# Patient Record
Sex: Female | Born: 1945 | Race: White | Hispanic: No | Marital: Married
Health system: Southern US, Community
[De-identification: ages and names within clinical notes are randomized; demographics above are authoritative.]

---

## 2002-12-24 HISTORY — PX: BREAST EXCISIONAL BIOPSY: SUR124

## 2005-09-24 ENCOUNTER — Ambulatory Visit: Payer: Self-pay | Admitting: Internal Medicine

## 2005-10-10 ENCOUNTER — Ambulatory Visit: Payer: Self-pay | Admitting: Internal Medicine

## 2005-10-22 ENCOUNTER — Ambulatory Visit: Payer: Self-pay | Admitting: Rheumatology

## 2005-12-10 ENCOUNTER — Ambulatory Visit: Payer: Self-pay

## 2006-06-19 ENCOUNTER — Ambulatory Visit: Payer: Self-pay | Admitting: Rheumatology

## 2009-10-18 ENCOUNTER — Ambulatory Visit: Payer: Self-pay | Admitting: Internal Medicine

## 2011-01-01 ENCOUNTER — Ambulatory Visit: Payer: Self-pay | Admitting: Internal Medicine

## 2011-01-17 ENCOUNTER — Ambulatory Visit: Payer: Self-pay | Admitting: Unknown Physician Specialty

## 2011-02-15 ENCOUNTER — Ambulatory Visit: Payer: Self-pay | Admitting: Unknown Physician Specialty

## 2011-02-19 ENCOUNTER — Ambulatory Visit: Payer: Self-pay | Admitting: Unknown Physician Specialty

## 2011-08-29 ENCOUNTER — Ambulatory Visit: Payer: Self-pay | Admitting: Unknown Physician Specialty

## 2013-10-19 ENCOUNTER — Ambulatory Visit: Payer: Self-pay | Admitting: Internal Medicine

## 2013-12-10 ENCOUNTER — Ambulatory Visit: Payer: Self-pay | Admitting: Physician Assistant

## 2013-12-10 LAB — CBC WITH DIFFERENTIAL/PLATELET
Basophil %: 0.8 %
Eosinophil #: 0.3 10*3/uL (ref 0.0–0.7)
Eosinophil %: 3.4 %
HCT: 39.4 % (ref 35.0–47.0)
HGB: 13.3 g/dL (ref 12.0–16.0)
Lymphocyte %: 15.1 %
MCH: 31.5 pg (ref 26.0–34.0)
MCHC: 33.8 g/dL (ref 32.0–36.0)
Monocyte #: 0.7 x10 3/mm (ref 0.2–0.9)
Monocyte %: 6.8 %
Neutrophil %: 73.9 %
Platelet: 265 10*3/uL (ref 150–440)
RDW: 13.4 % (ref 11.5–14.5)

## 2013-12-10 LAB — COMPREHENSIVE METABOLIC PANEL
Anion Gap: 10 (ref 7–16)
Calcium, Total: 9.3 mg/dL (ref 8.5–10.1)
Chloride: 102 mmol/L (ref 98–107)
Co2: 26 mmol/L (ref 21–32)
EGFR (African American): >60
EGFR (Non-African Amer.): 58 — ABNORMAL LOW
Glucose: 117 mg/dL — ABNORMAL HIGH (ref 65–99)
SGOT(AST): 24 U/L (ref 15–37)
SGPT (ALT): 36 U/L (ref 12–78)
Sodium: 138 mmol/L (ref 136–145)
Total Protein: 7.5 g/dL (ref 6.4–8.2)

## 2013-12-11 ENCOUNTER — Ambulatory Visit: Payer: Self-pay | Admitting: Physician Assistant

## 2014-10-13 ENCOUNTER — Ambulatory Visit: Payer: Self-pay

## 2015-08-22 ENCOUNTER — Other Ambulatory Visit: Payer: Self-pay | Admitting: Internal Medicine

## 2015-08-22 DIAGNOSIS — Z1231 Encounter for screening mammogram for malignant neoplasm of breast: Secondary | ICD-10-CM

## 2016-12-26 ENCOUNTER — Other Ambulatory Visit: Payer: Self-pay | Admitting: Internal Medicine

## 2016-12-26 DIAGNOSIS — N6320 Unspecified lump in the left breast, unspecified quadrant: Secondary | ICD-10-CM

## 2017-01-04 ENCOUNTER — Ambulatory Visit: Payer: Self-pay

## 2017-01-04 ENCOUNTER — Other Ambulatory Visit: Payer: Self-pay

## 2017-01-17 ENCOUNTER — Ambulatory Visit
Admission: RE | Admit: 2017-01-17 | Discharge: 2017-01-17 | Disposition: A | Discharge status: Home / Self Care | Payer: 59 | Source: Ambulatory Visit | Attending: Internal Medicine | Admitting: Internal Medicine

## 2017-01-17 ENCOUNTER — Encounter: Payer: Self-pay | Admitting: Radiology

## 2017-01-17 DIAGNOSIS — R921 Mammographic calcification found on diagnostic imaging of breast: Secondary | ICD-10-CM | POA: Diagnosis not present

## 2017-01-17 DIAGNOSIS — N6320 Unspecified lump in the left breast, unspecified quadrant: Secondary | ICD-10-CM

## 2018-05-16 IMAGING — US US BREAST*L* LIMITED INC AXILLA
1 series · 16 of 25 positions shown · non-contrast
Comparison: Previous exam(s).

CLINICAL DATA: Left breast upper outer quadrant area of palpable
concern. History of right breast excisional biopsy for multiple
papillomas.

EXAM:
2D DIGITAL DIAGNOSTIC BILATERAL MAMMOGRAM WITH CAD AND ADJUNCT TOMO
ULTRASOUND BILATERAL BREAST

[Series 1: us breast*left* limited inc axilla · 0.06mm/px · 16 of 52 slices shown]
[im 1/52]
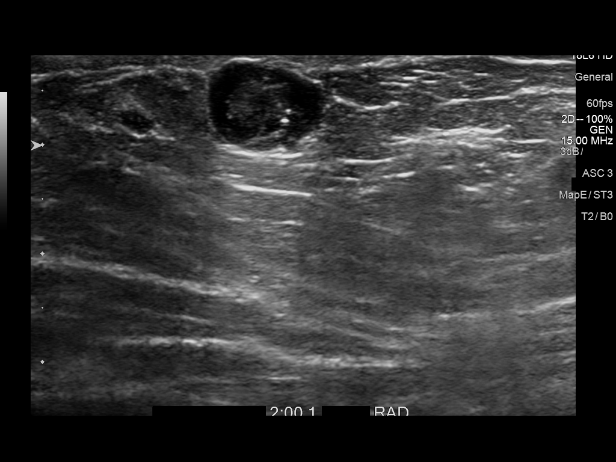
[im 5/52]
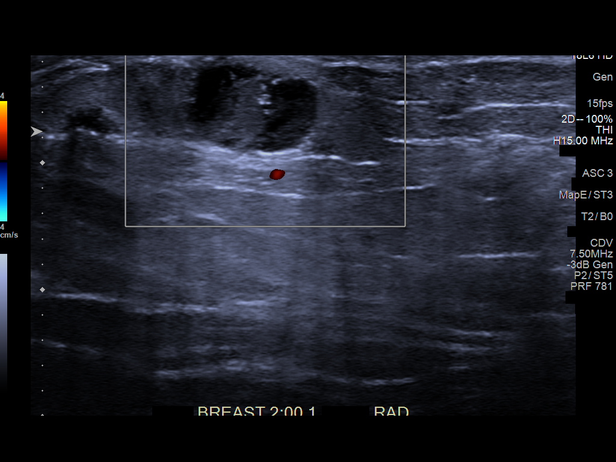
[im 7/52]
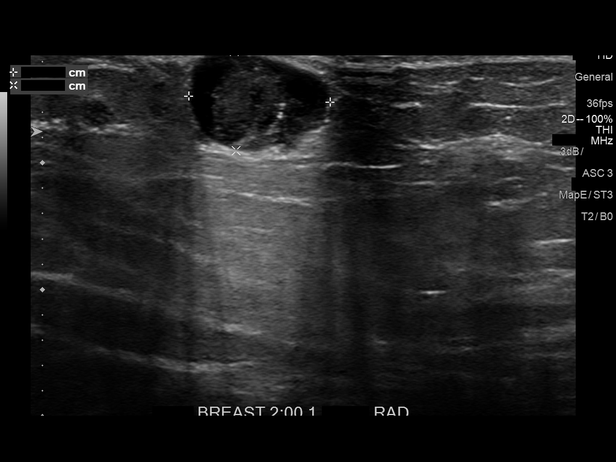
[im 11/52]
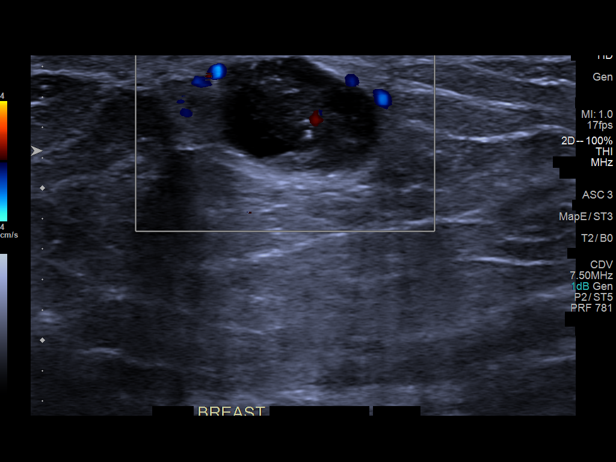
[im 15/52]
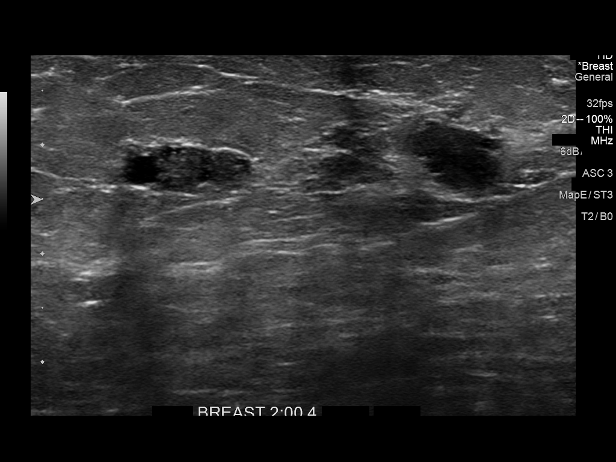
[im 18/52]
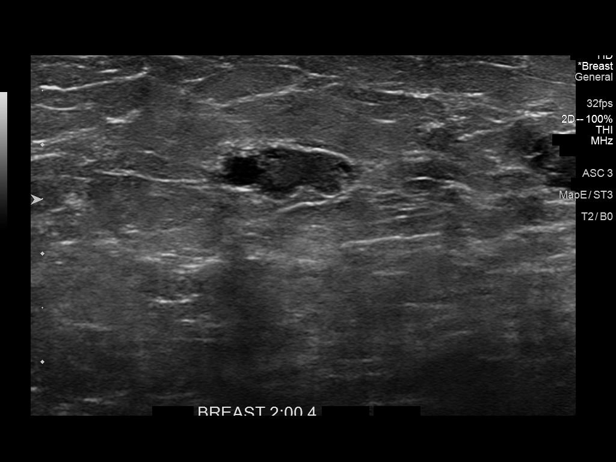
[im 22/52]
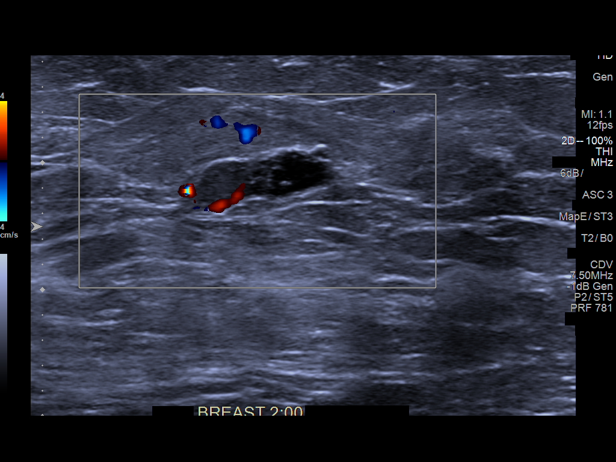
[im 24/52]
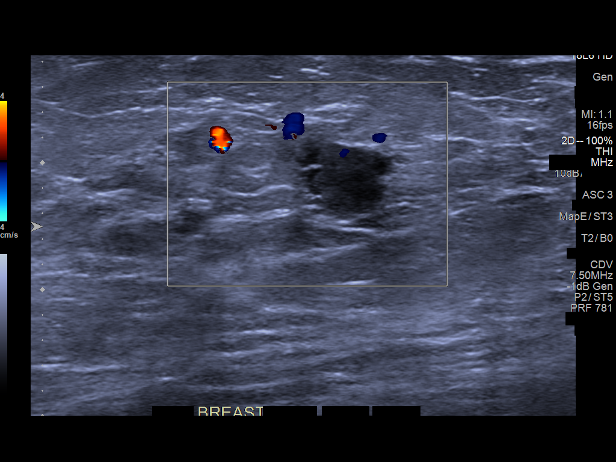
[im 28/52]
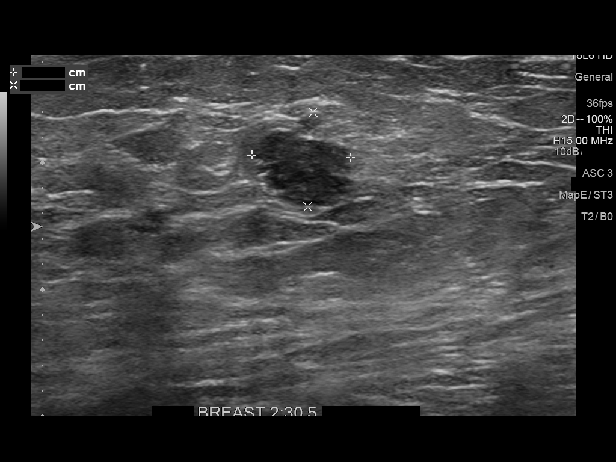
[im 30/52]
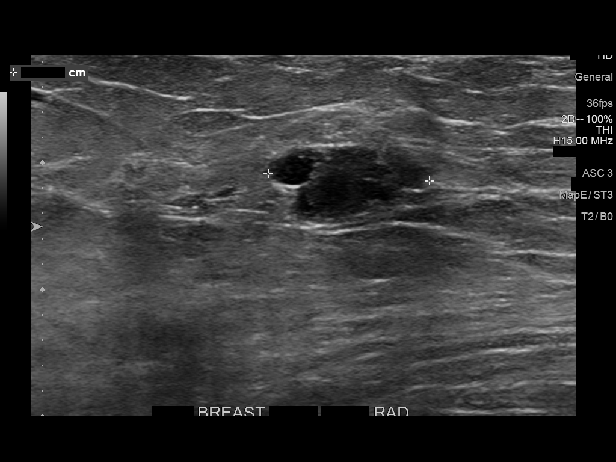
[im 35/52]
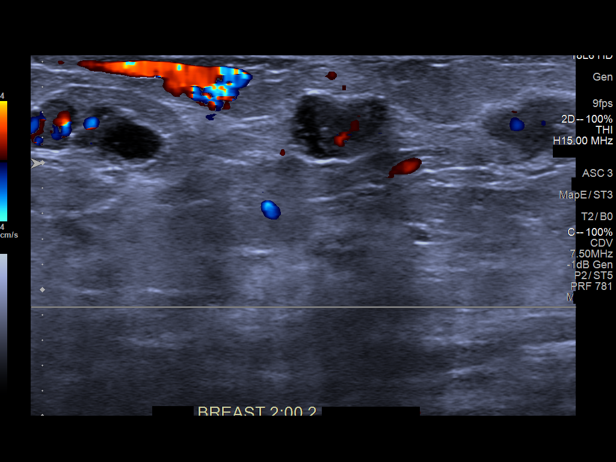
[im 37/52]
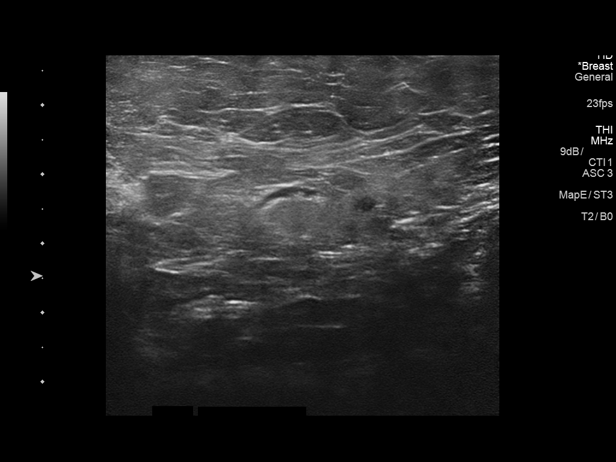
[im 41/52]
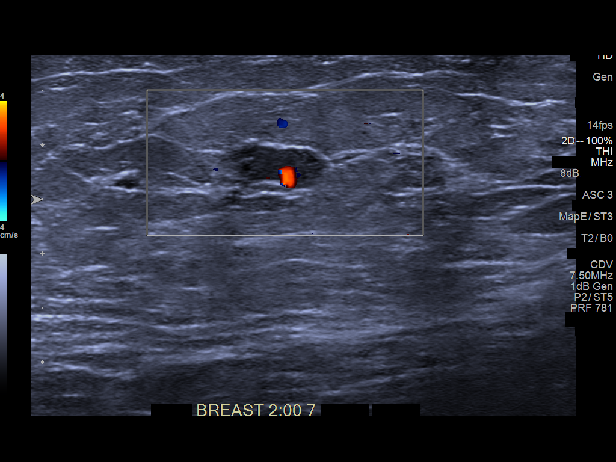
[im 45/52]
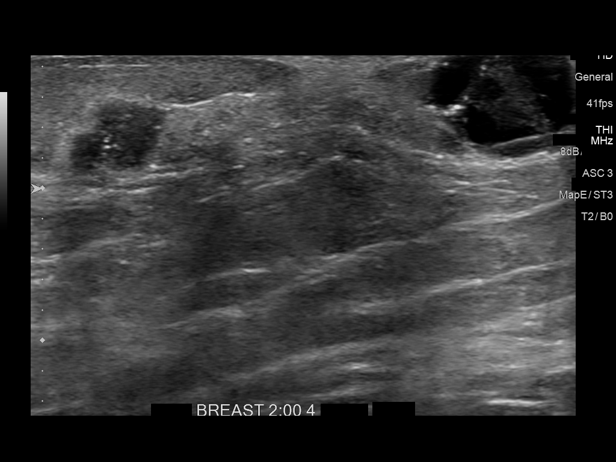
[im 47/52]
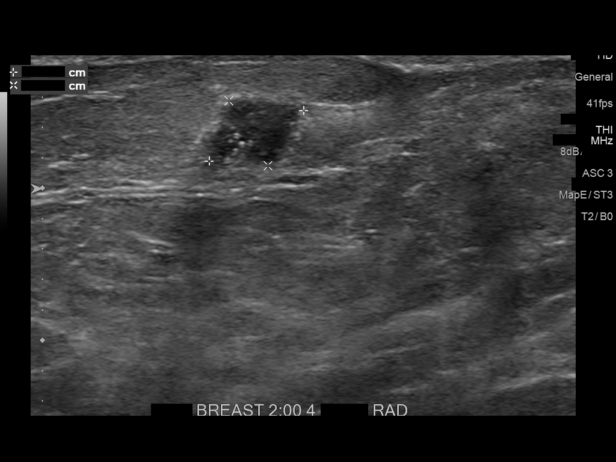
[im 52/52]
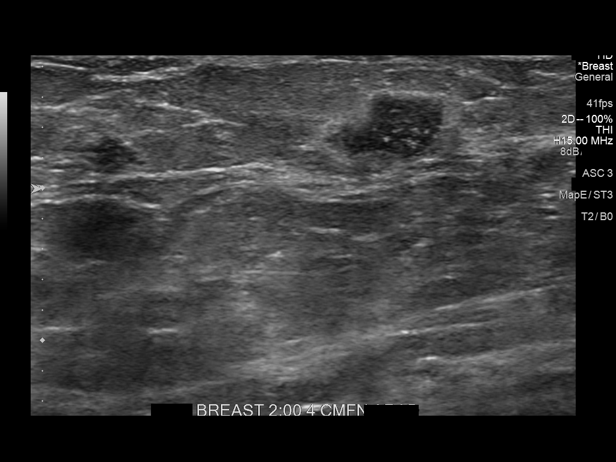

[16 of 25 positions shown; findings below may reference images not displayed]

ACR Breast Density Category c: The breast tissue is heterogeneously
dense, which may obscure small masses.
FINDINGS: Mammographically, there are multiple circumscribed to partially
obscured masses in bilateral breasts. A particularly suspicious
somewhat irregularly bordered 7 by 6 mm mass is seen in the lateral
right breast, middle depth, which according to the tomosynthesis
images is located in the lower outer quadrant. There is also a group
of indeterminate calcifications in the slightly lower outer quadrant
of the right breast, posterior depth measuring 7 x 3 x 4 mm.

In the left breast there are numerous slightly irregularly bordered
masses, many of which contain intramural calcifications. The area of
palpable concern in the left breast upper outer quadrant, anterior
depth, corresponds to 1 such mass. Another prominent such mass is
seen in the left breast upper outer quadrant, middle to posterior
depth. There is also a group of suspicious pleomorphic
calcifications in linear distribution in the left breast upper outer
quadrant, anterior depth which measures 16 x 5 x 5 mm. At least 4
other smaller groups of indeterminate calcifications are seen
throughout the left breast, predominantly in the upper outer
quadrant.

Mammographic images were processed with CAD.

On physical exam, there are 2 palpable masses in the left breast
upper outer quadrant, anterior depth, the more anterior of which was
felt by the patient.

Targeted left breast ultrasound is performed, showing multiple
complex cystic versus solid intraductal masses in the left breast.
The area of palpable concern corresponds to a left breast 2 o'clock
1 cm from the nipple solid intracystic mass containing
calcifications. The abnormality measures 1.1 x 0.8 x 1.1 cm.
Internal blood flow is documented. Additional intraductal masses are
seen in the left breast 2 o'clock 2 and 4 cm from the nipple, and
230 o'clock 5 cm from the nipple. The 230 o'clock 5 cm from the
nipple mass measures 0.8 by 0.8 by 1.3 cm and demonstrates markedly
irregular borders. In the left breast 2 o'clock 4 cm from the
nipple, there is an irregular tubular in shape mass which measures
approximately 0.7 x 0.7 x 0.5 cm and demonstrates numerous
intramural calcifications. I believe this mass corresponds to the
group of calcifications in the left breast upper outer quadrant seen
mammographically. In the left breast 2 o'clock 7 cm from the nipple,
there is another intraductal mass which measures approximately
by 0.4 by 0.5 cm.

Targeted right breast ultrasound is performed, showing complex
cystic versus intraductal masses in the right breast 9 o'clock 4 cm
from the nipple measuring 0.6 x 0.9 x 0.4 cm. Complicated cysts
versus cystic mass is also seen in the right breast 9 o'clock 8 cm
from the nipple measuring 0.7 x 0.8 x 0.3 cm. It is not certain
whether any of these findings corresponds to the mammographically
seen suspicious nodule in the lateral right breast, middle to
posterior depth.

No evidence of axillary lymphadenopathy bilaterally.
IMPRESSION: Probable bilateral breast papillomatosis, with suspected
transformation to DCIS or more severe pathology in several breast
masses, and bilateral groups of suspicious calcifications.

RECOMMENDATION:
Left breast: Ultrasound-guided core needle biopsy of 3 most
suspicious looking masses-- palpable left breast 2 o'clock 1 cm from
the nipple mass, 230 o'clock 5 cm from the nipple irregular mass and
2 o'clock 4 cm from the nipple calcifications containing mass.
Attention on postprocedural mammogram is recommended to document
that left breast calcifications containing mass in the 2 o'clock 4
cm from the nipple corresponds to the group of suspicious
calcifications in the left breast upper outer quadrant, anterior
depth. If this is not the case, stereotactic core needle biopsy of
these calcifications should be performed.

Right breast: Stereotactic core needle biopsy of 7 mm lateral right
breast mass, far posterior depth, and stereotactic core needle
biopsy of group of indeterminate calcifications in the right breast
slightly lower outer quadrant, posterior depth.

I have discussed the findings and recommendations with the patient.
Results were also provided in writing at the conclusion of the
visit. If applicable, a reminder letter will be sent to the patient
regarding the next appointment.

BI-RADS CATEGORY  4: Suspicious.

## 2018-05-16 IMAGING — US US BREAST*R* LIMITED INC AXILLA
2 series · 16 of 18 positions shown · non-contrast
Comparison: Previous exam(s).

CLINICAL DATA: Left breast upper outer quadrant area of palpable
concern. History of right breast excisional biopsy for multiple
papillomas.

EXAM:
2D DIGITAL DIAGNOSTIC BILATERAL MAMMOGRAM WITH CAD AND ADJUNCT TOMO
ULTRASOUND BILATERAL BREAST

[Series 1: us breast*right* limited inc axilla · 0.06mm/px · 15 of 17 slices shown (1 of 2)]
[im 1/17]
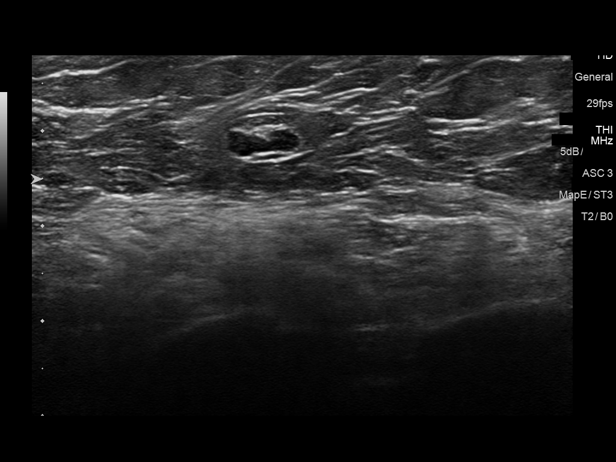
[im 2/17]
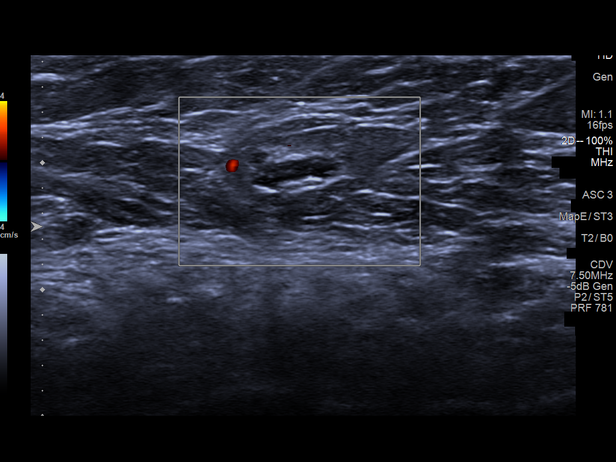
[im 3/17]
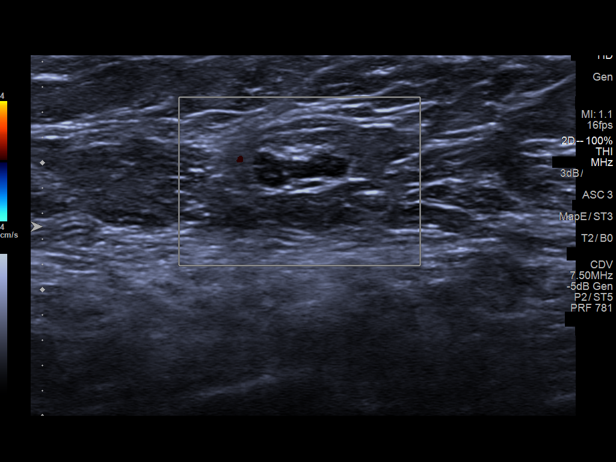
[im 4/17]
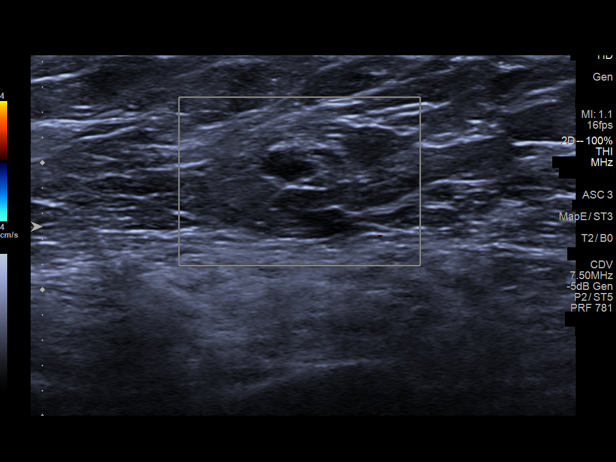
[im 6/17]
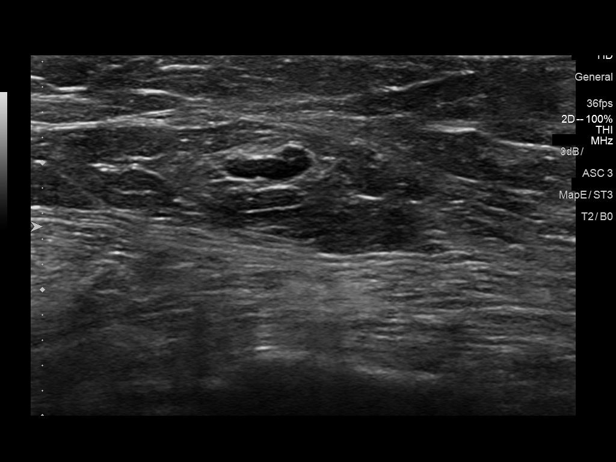
[im 7/17]
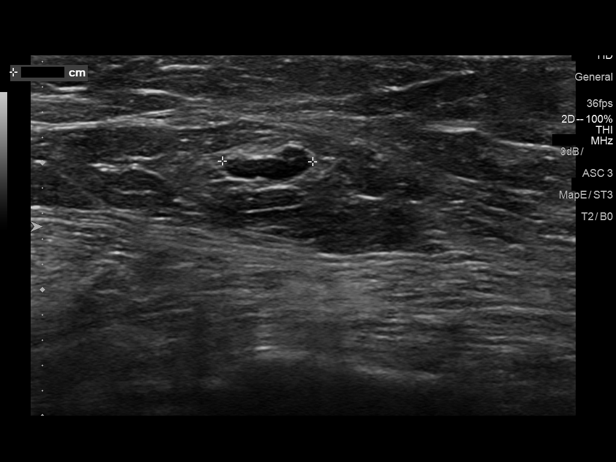
[im 8/17]
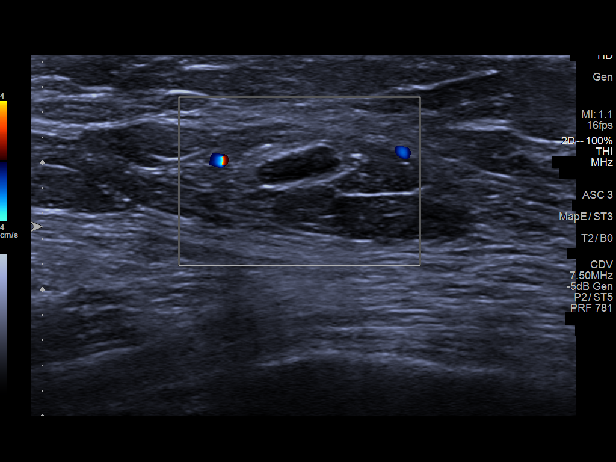
[im 9/17]
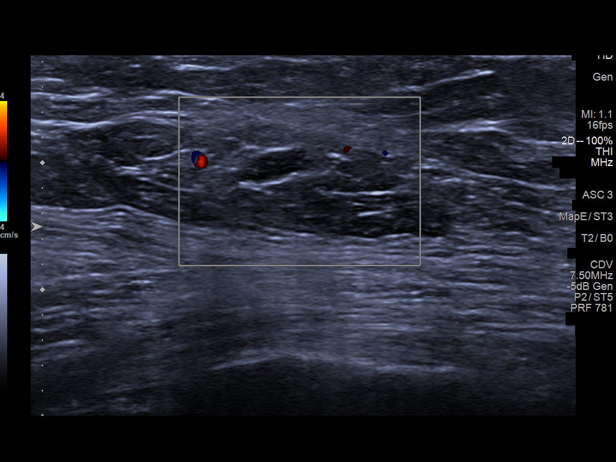
[im 10/17]
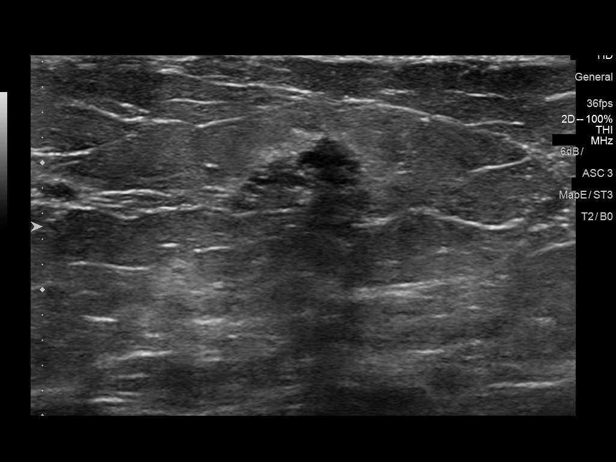
[im 11/17]
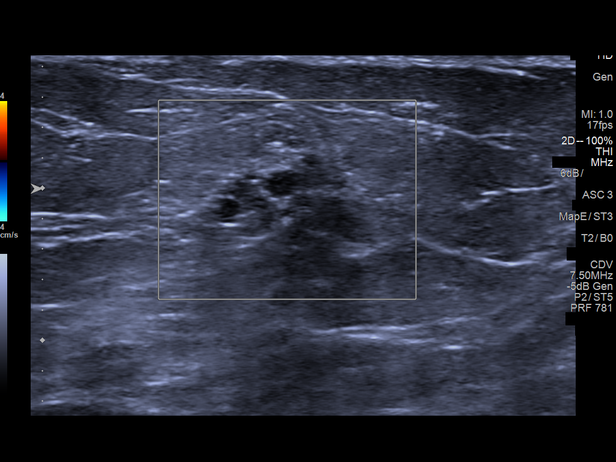
[im 12/17]
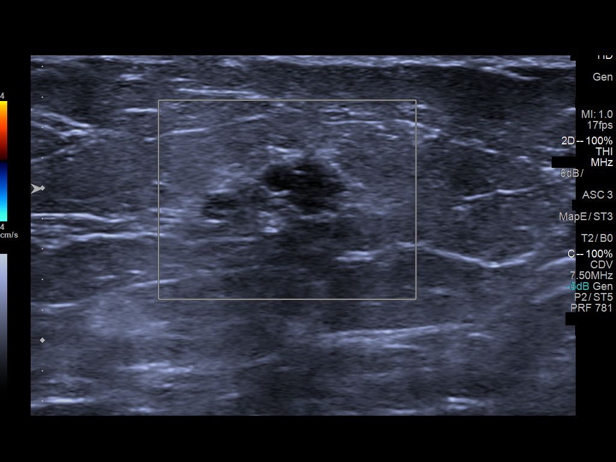
[im 13/17]
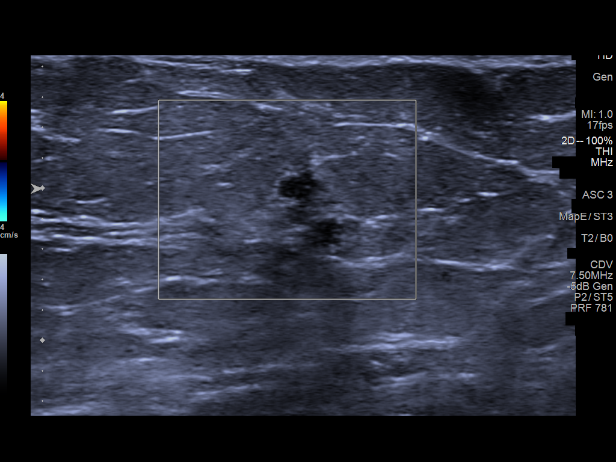
[im 15/17]
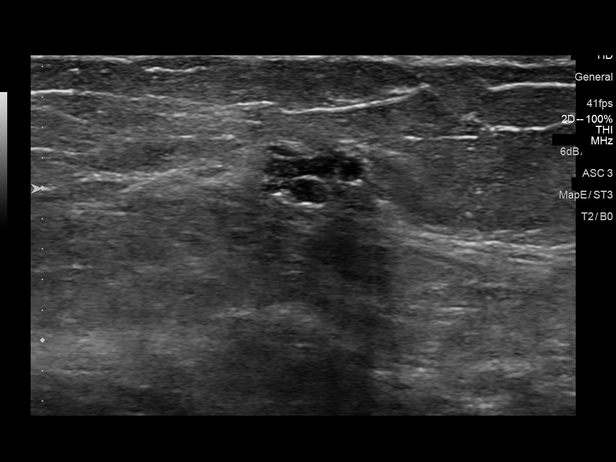
[im 16/17]
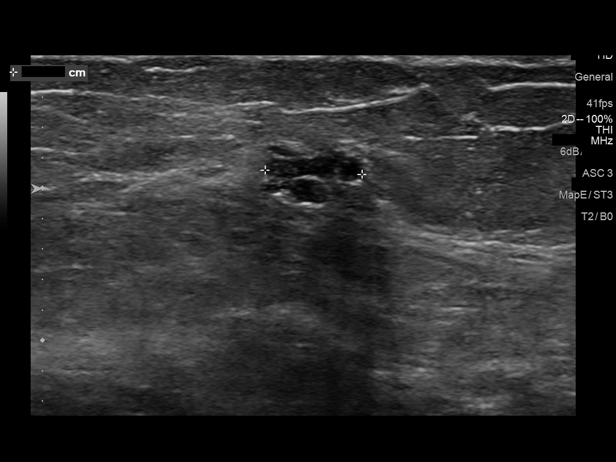
[im 17/17]
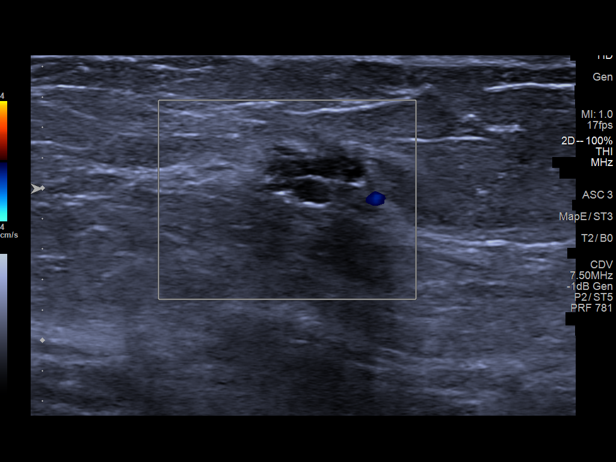

[Series 2: us breast*right* limited inc axilla · 0.07mm/px · 1 of 1 slices shown (2 of 2)]
[im 1/1]
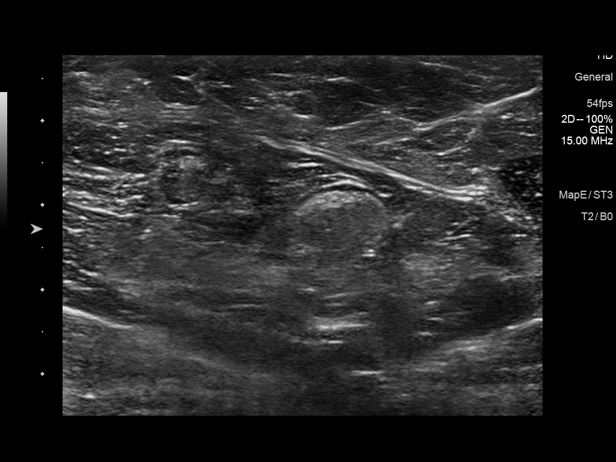

[16 of 18 positions shown; findings below may reference images not displayed]

ACR Breast Density Category c: The breast tissue is heterogeneously
dense, which may obscure small masses.
FINDINGS: Mammographically, there are multiple circumscribed to partially
obscured masses in bilateral breasts. A particularly suspicious
somewhat irregularly bordered 7 by 6 mm mass is seen in the lateral
right breast, middle depth, which according to the tomosynthesis
images is located in the lower outer quadrant. There is also a group
of indeterminate calcifications in the slightly lower outer quadrant
of the right breast, posterior depth measuring 7 x 3 x 4 mm.

In the left breast there are numerous slightly irregularly bordered
masses, many of which contain intramural calcifications. The area of
palpable concern in the left breast upper outer quadrant, anterior
depth, corresponds to 1 such mass. Another prominent such mass is
seen in the left breast upper outer quadrant, middle to posterior
depth. There is also a group of suspicious pleomorphic
calcifications in linear distribution in the left breast upper outer
quadrant, anterior depth which measures 16 x 5 x 5 mm. At least 4
other smaller groups of indeterminate calcifications are seen
throughout the left breast, predominantly in the upper outer
quadrant.

Mammographic images were processed with CAD.

On physical exam, there are 2 palpable masses in the left breast
upper outer quadrant, anterior depth, the more anterior of which was
felt by the patient.

Targeted left breast ultrasound is performed, showing multiple
complex cystic versus solid intraductal masses in the left breast.
The area of palpable concern corresponds to a left breast 2 o'clock
1 cm from the nipple solid intracystic mass containing
calcifications. The abnormality measures 1.1 x 0.8 x 1.1 cm.
Internal blood flow is documented. Additional intraductal masses are
seen in the left breast 2 o'clock 2 and 4 cm from the nipple, and
230 o'clock 5 cm from the nipple. The 230 o'clock 5 cm from the
nipple mass measures 0.8 by 0.8 by 1.3 cm and demonstrates markedly
irregular borders. In the left breast 2 o'clock 4 cm from the
nipple, there is an irregular tubular in shape mass which measures
approximately 0.7 x 0.7 x 0.5 cm and demonstrates numerous
intramural calcifications. I believe this mass corresponds to the
group of calcifications in the left breast upper outer quadrant seen
mammographically. In the left breast 2 o'clock 7 cm from the nipple,
there is another intraductal mass which measures approximately
by 0.4 by 0.5 cm.

Targeted right breast ultrasound is performed, showing complex
cystic versus intraductal masses in the right breast 9 o'clock 4 cm
from the nipple measuring 0.6 x 0.9 x 0.4 cm. Complicated cysts
versus cystic mass is also seen in the right breast 9 o'clock 8 cm
from the nipple measuring 0.7 x 0.8 x 0.3 cm. It is not certain
whether any of these findings corresponds to the mammographically
seen suspicious nodule in the lateral right breast, middle to
posterior depth.

No evidence of axillary lymphadenopathy bilaterally.
IMPRESSION: Probable bilateral breast papillomatosis, with suspected
transformation to DCIS or more severe pathology in several breast
masses, and bilateral groups of suspicious calcifications.

RECOMMENDATION:
Left breast: Ultrasound-guided core needle biopsy of 3 most
suspicious looking masses-- palpable left breast 2 o'clock 1 cm from
the nipple mass, 230 o'clock 5 cm from the nipple irregular mass and
2 o'clock 4 cm from the nipple calcifications containing mass.
Attention on postprocedural mammogram is recommended to document
that left breast calcifications containing mass in the 2 o'clock 4
cm from the nipple corresponds to the group of suspicious
calcifications in the left breast upper outer quadrant, anterior
depth. If this is not the case, stereotactic core needle biopsy of
these calcifications should be performed.

Right breast: Stereotactic core needle biopsy of 7 mm lateral right
breast mass, far posterior depth, and stereotactic core needle
biopsy of group of indeterminate calcifications in the right breast
slightly lower outer quadrant, posterior depth.

I have discussed the findings and recommendations with the patient.
Results were also provided in writing at the conclusion of the
visit. If applicable, a reminder letter will be sent to the patient
regarding the next appointment.

BI-RADS CATEGORY  4: Suspicious.

## 2020-03-31 ENCOUNTER — Ambulatory Visit: Payer: 59 | Admitting: Dermatology

## 2021-10-26 ENCOUNTER — Ambulatory Visit: Payer: Medicare Other | Admitting: Dermatology

## 2021-10-26 ENCOUNTER — Other Ambulatory Visit: Payer: Self-pay

## 2021-10-26 DIAGNOSIS — L820 Inflamed seborrheic keratosis: Secondary | ICD-10-CM | POA: Diagnosis not present

## 2021-10-26 DIAGNOSIS — L720 Epidermal cyst: Secondary | ICD-10-CM | POA: Diagnosis not present

## 2021-10-26 DIAGNOSIS — L219 Seborrheic dermatitis, unspecified: Secondary | ICD-10-CM

## 2021-10-26 MED ORDER — KETOCONAZOLE 2 % EX SHAM: MEDICATED_SHAMPOO | CUTANEOUS | 11 refills | Status: DC

## 2021-10-26 NOTE — Progress Notes (Signed)
   Follow-Up Visit   Subjective  Erin Turner is a 75 y.o. female who presents for the following: Follow-up (Patient reports a new spot at right lower leg she noticed several weeks ago. Patient has applied several creams and is not going away. ).  She has rough areas on her face and chest and leg that are irritating.  She has a spot on her wrist. She has scaliness of her scalp that she would like evaluated and treated.  The following portions of the chart were reviewed this encounter and updated as appropriate:  Allergies  Meds  Problems  Med Hx  Surg Hx  Fam Hx     Review of Systems: No other skin or systemic complaints except as noted in HPI or Assessment and Plan.  Objective  Well appearing patient in no apparent distress; mood and affect are within normal limits.  A focused examination was performed including right leg, right chest, neck, face. Relevant physical exam findings are noted in the Assessment and Plan.  right pretibial x 1, right chest x 1, left cheek x 16 (18) Erythematous keratotic or waxy stuck-on papule or plaque.   Scalp Pink patches with greasy scale.   right wrist Subcutaneous nodule.   Assessment & Plan  Inflamed seborrheic keratosis right pretibial x 1, right chest x 1, left cheek x 16  Destruction of lesion - right pretibial x 1, right chest x 1, left cheek x 16 Complexity: simple   Destruction method: cryotherapy   Informed consent: discussed and consent obtained   Timeout:  patient name, date of birth, surgical site, and procedure verified Lesion destroyed using liquid nitrogen: Yes   Region frozen until ice ball extended beyond lesion: Yes   Outcome: patient tolerated procedure well with no complications   Post-procedure details: wound care instructions given    Seborrheic dermatitis Scalp  Seborrheic Dermatitis  -  is a chronic persistent rash characterized by pinkness and scaling most commonly of the mid face but also can occur  on the scalp (dandruff), ears; mid chest, mid back and groin.  It tends to be exacerbated by stress and cooler weather.  People who have neurologic disease may experience new onset or exacerbation of existing seborrheic dermatitis.  The condition is not curable but treatable and can be controlled.  Start ketoconazole shampoo 2 % - apply topically three times per week, massage into scalp and leave in for 10 minutes before rinsing out ketoconazole (NIZORAL) 2 % shampoo - Scalp apply three times per week, massage into scalp and leave in for 10 minutes before rinsing out  Epidermal inclusion cyst right wrist Benign-appearing. Exam most consistent with an epidermal inclusion cyst. Discussed that a cyst is a benign growth that can grow over time and sometimes get irritated or inflamed. Recommend observation if it is not bothersome. Discussed option of surgical excision to remove it if it is growing, symptomatic, or other changes noted. Please call for new or changing lesions so they can be evaluated.  Return for 1 - 2 month follow up isk . IAsher Muir, CMA, am acting as scribe for Armida Sans, MD. Documentation: I have reviewed the above documentation for accuracy and completeness, and I agree with the above.  Armida Sans, MD

## 2021-10-26 NOTE — Patient Instructions (Signed)
Seborrheic Keratosis  What causes seborrheic keratoses? Seborrheic keratoses are harmless, common skin growths that first appear during adult life.  As time goes by, more growths appear.  Some people may develop a large number of them.  Seborrheic keratoses appear on both covered and uncovered body parts.  They are not caused by sunlight.  The tendency to develop seborrheic keratoses can be inherited.  They vary in color from skin-colored to gray, brown, or even black.  They can be either smooth or have a rough, warty surface.   Seborrheic keratoses are superficial and look as if they were stuck on the skin.  Under the microscope this type of keratosis looks like layers upon layers of skin.  That is why at times the top layer may seem to fall off, but the rest of the growth remains and re-grows.    Treatment Seborrheic keratoses do not need to be treated, but can easily be removed in the office.  Seborrheic keratoses often cause symptoms when they rub on clothing or jewelry.  Lesions can be in the way of shaving.  If they become inflamed, they can cause itching, soreness, or burning.  Removal of a seborrheic keratosis can be accomplished by freezing, burning, or surgery. If any spot bleeds, scabs, or grows rapidly, please return to have it checked, as these can be an indication of a skin cancer.  Cryotherapy Aftercare  Wash gently with soap and water everyday.   Apply Vaseline and Band-Aid daily until healed.      If you have any questions or concerns for your doctor, please call our main line at [phone] and press option 4 to reach your doctor's medical assistant. If no one answers, please leave a voicemail as directed and we will return your call as soon as possible. Messages left after 4 pm will be answered the following business day.   You may also send us a message via MyChart. We typically respond to MyChart messages within 1-2 business days.  For prescription refills, please ask your  pharmacy to contact our office. Our fax number is [phone].  If you have an urgent issue when the clinic is closed that cannot wait until the next business day, you can page your doctor at the number below.    Please note that while we do our best to be available for urgent issues outside of office hours, we are not available 24/7.   If you have an urgent issue and are unable to reach us, you may choose to seek medical care at your doctor's office, retail clinic, urgent care center, or emergency room.  If you have a medical emergency, please immediately call 911 or go to the emergency department.  Pager Numbers  - Dr. Kowalski: [phone]  - Dr. Moye: [phone]  - Dr. Stewart: [phone]  In the event of inclement weather, please call our main line at [phone] for an update on the status of any delays or closures.  Dermatology Medication Tips: Please keep the boxes that topical medications come in in order to help keep track of the instructions about where and how to use these. Pharmacies typically print the medication instructions only on the boxes and not directly on the medication tubes.   If your medication is too expensive, please contact our office at [phone] option 4 or send us a message through MyChart.   We are unable to tell what your co-pay for medications will be in advance as this is different depending on your   insurance coverage. However, we may be able to find a substitute medication at lower cost or fill out paperwork to get insurance to cover a needed medication.   If a prior authorization is required to get your medication covered by your insurance company, please allow us 1-2 business days to complete this process.  Drug prices often vary depending on where the prescription is filled and some pharmacies may offer cheaper prices.  The website www.goodrx.com contains coupons for medications through different pharmacies. The prices here do not  account for what the cost may be with help from insurance (it may be cheaper with your insurance), but the website can give you the price if you did not use any insurance.  - You can print the associated coupon and take it with your prescription to the pharmacy.  - You may also stop by our office during regular business hours and pick up a GoodRx coupon card.  - If you need your prescription sent electronically to a different pharmacy, notify our office through [facility] MyChart or by phone at [phone] option 4.  

## 2021-10-30 ENCOUNTER — Encounter: Payer: Self-pay | Admitting: Dermatology

## 2021-12-27 ENCOUNTER — Ambulatory Visit: Payer: Medicare Other | Admitting: Dermatology

## 2022-03-26 ENCOUNTER — Ambulatory Visit: Payer: Medicare Other | Admitting: Dermatology

## 2022-03-26 ENCOUNTER — Encounter: Payer: Self-pay | Admitting: Dermatology

## 2022-03-26 DIAGNOSIS — L578 Other skin changes due to chronic exposure to nonionizing radiation: Secondary | ICD-10-CM | POA: Diagnosis not present

## 2022-03-26 DIAGNOSIS — L821 Other seborrheic keratosis: Secondary | ICD-10-CM

## 2022-03-26 DIAGNOSIS — L219 Seborrheic dermatitis, unspecified: Secondary | ICD-10-CM

## 2022-03-26 DIAGNOSIS — L820 Inflamed seborrheic keratosis: Secondary | ICD-10-CM | POA: Diagnosis not present

## 2022-03-26 MED ORDER — MOMETASONE FUROATE 0.1 % EX SOLN: CUTANEOUS | 2 refills | Status: AC

## 2022-03-26 NOTE — Progress Notes (Signed)
? ?Follow-Up Visit ?  ?Subjective  ?Erin Turner is a 76 y.o. female who presents for the following: Irritated seborrheic keratoses (5 month recheck. Tx with LN2. Face and chest. Has areas on face and neck she would like treated today. Irritated, itching). ?The patient has spots, moles and lesions to be evaluated, some may be new or changing and the patient has concerns that these could be cancer. ? ?The following portions of the chart were reviewed this encounter and updated as appropriate:  Allergies  Meds  Problems  Med Hx  Surg Hx  Fam Hx   ?  ?Review of Systems: No other skin or systemic complaints except as noted in HPI or Assessment and Plan. ? ?Objective  ?Well appearing patient in no apparent distress; mood and affect are within normal limits. ? ?A focused examination was performed including face, neck. Relevant physical exam findings are noted in the Assessment and Plan. ? ?face and ears x19, posterior neck/hairline x9, right anterior shoulder x1 (29) ?Erythematous keratotic or waxy stuck-on papule or plaque. ? ? ?Assessment & Plan  ?Inflamed seborrheic keratosis (29) ?face and ears x19, posterior neck/hairline x9, right anterior shoulder x1 ?Irritated ?Destruction of lesion - face and ears x19, posterior neck/hairline x9, right anterior shoulder x1 ?Complexity: simple   ?Destruction method: cryotherapy   ?Informed consent: discussed and consent obtained   ?Timeout:  patient name, date of birth, surgical site, and procedure verified ?Lesion destroyed using liquid nitrogen: Yes   ?Region frozen until ice ball extended beyond lesion: Yes   ?Outcome: patient tolerated procedure well with no complications   ?Post-procedure details: wound care instructions given   ? ?Seborrheic dermatitis ?Scalp ?With pruritis ?Chronic and persistent condition with duration or expected duration over one year. Condition is symptomatic / bothersome to patient. Not to goal. ?Seborrheic Dermatitis  ?-  is a chronic  persistent rash characterized by pinkness and scaling most commonly of the mid face but also can occur on the scalp (dandruff), ears; mid chest, mid back and groin.  It tends to be exacerbated by stress and cooler weather.  People who have neurologic disease may experience new onset or exacerbation of existing seborrheic dermatitis.  The condition is not curable but treatable and can be controlled. ? ?Continue Ketoconazole 2% shampoo at least 3 days per week.  ?Start Mometasone lotion apply to affected areas on scalp 1 hour prior to washing hair.  ? ?mometasone (ELOCON) 0.1 % lotion - Scalp ?Apply to affected areas on scalp 1 hour prior to washin ?Related Medications ?ketoconazole (NIZORAL) 2 % shampoo ?apply three times per week, massage into scalp and leave in for 10 minutes before rinsing out ? ?Actinic Damage ?- chronic, secondary to cumulative UV radiation exposure/sun exposure over time ?- diffuse scaly erythematous macules with underlying dyspigmentation ?- Recommend daily broad spectrum sunscreen SPF 30+ to sun-exposed areas, reapply every 2 hours as needed.  ?- Recommend staying in the shade or wearing long sleeves, sun glasses (UVA+UVB protection) and wide brim hats (4-inch brim around the entire circumference of the hat). ?- Call for new or changing lesions. ? ?Seborrheic Keratoses ?- Stuck-on, waxy, tan-brown papules and/or plaques  ?- Benign-appearing ?- Discussed benign etiology and prognosis. ?- Observe ?- Call for any changes ? ?Return for ISK Follow Up 3-4 months. ? ?I, Lawson Radar, CMA, am acting as scribe for Armida Sans, MD. ?Documentation: I have reviewed the above documentation for accuracy and completeness, and I agree with the above. ? ?Armida Sans,  MD ? ?

## 2022-03-26 NOTE — Patient Instructions (Addendum)
Cryotherapy Aftercare ? ?Wash gently with soap and water everyday.   ?Apply Vaseline and Band-Aid daily until healed.  ? ?Prior to procedure, discussed risks of blister formation, small wound, skin dyspigmentation, or rare scar following cryotherapy. Recommend Vaseline ointment to treated areas while healing.  ? ?Scalp: ?Continue Ketoconazole 2% shampoo at least 3 days per week.  ? ?Start Mometasone lotion apply to affected areas on scalp 1 hour prior to washing hair.  ? ? ? ?If You Need Anything After Your Visit ? ?If you have any questions or concerns for your doctor, please call our main line at [phone] and press option 4 to reach your doctor's medical assistant. If no one answers, please leave a voicemail as directed and we will return your call as soon as possible. Messages left after 4 pm will be answered the following business day.  ? ?You may also send [place] a message via MyChart. We typically respond to MyChart messages within 1-2 business days. ? ?For prescription refills, please ask your pharmacy to contact our office. Our fax number is [phone]. ? ?If you have an urgent issue when the clinic is closed that cannot wait until the next business day, you can page your doctor at the number below.   ? ?Please note that while we do our best to be available for urgent issues outside of office hours, we are not available 24/7.  ? ?If you have an urgent issue and are unable to reach [place], you may choose to seek medical care at your doctor's office, retail clinic, urgent care center, or emergency room. ? ?If you have a medical emergency, please immediately call 911 or go to the emergency department. ? ?Pager Numbers ? ?- Dr. Gwen Pounds: [phone] ? ?- Dr. Neale Burly: [phone] ? ?- Dr. Roseanne Reno: [phone] ? ?In the event of inclement weather, please call our main line at [phone] for an update on the status of any delays or closures. ? ?Dermatology Medication Tips: ?Please keep the boxes that topical  medications come in in order to help keep track of the instructions about where and how to use these. Pharmacies typically print the medication instructions only on the boxes and not directly on the medication tubes.  ? ?If your medication is too expensive, please contact our office at [phone] option 4 or send [place] a message through MyChart.  ? ?We are unable to tell what your co-pay for medications will be in advance as this is different depending on your insurance coverage. However, we may be able to find a substitute medication at lower cost or fill out paperwork to get insurance to cover a needed medication.  ? ?If a prior authorization is required to get your medication covered by your insurance company, please allow [place] 1-2 business days to complete this process. ? ?Drug prices often vary depending on where the prescription is filled and some pharmacies may offer cheaper prices. ? ?The website www.goodrx.com contains coupons for medications through different pharmacies. The prices here do not account for what the cost may be with help from insurance (it may be cheaper with your insurance), but the website can give you the price if you did not use any insurance.  ?- You can print the associated coupon and take it with your prescription to the pharmacy.  ?- You may also stop by our office during regular business hours and pick up a GoodRx coupon card.  ?- If you need your prescription sent electronically to a different pharmacy, notify our  office through [facility] or by phone at [phone] option 4. ? ? ? ? ?Si Usted Necesita Algo Despu?s de Su Visita ? ?Tambi?n puede enviarnos un mensaje a trav?s de MyChart. Por lo general respondemos a los mensajes de MyChart en el transcurso de 1 a 2 d?as h?biles. ? ?Para renovar recetas, por favor pida a su farmacia que se ponga en contacto con nuestra oficina. Nuestro n?mero de fax es el [phone]. ? ?Si tiene un asunto urgente cuando la cl?nica est?  cerrada y que no puede esperar hasta el siguiente d?a h?bil, puede llamar/localizar a su doctor(a) al n?mero que aparece a continuaci?n.  ? ?Por favor, tenga en cuenta que aunque hacemos todo lo posible para estar disponibles para asuntos urgentes fuera del horario de oficina, no estamos disponibles las 24 horas del d?a, los 7 d?as de la semana.  ? ?Si tiene un problema urgente y no puede comunicarse con nosotros, puede optar por buscar atenci?n m?dica  en el consultorio de su doctor(a), en una cl?nica privada, en un centro de atenci?n urgente o en una sala de emergencias. ? ?Si tiene Radio broadcast assistant m?dica, por favor llame inmediatamente al 911 o vaya a la sala de emergencias. ? ?N?meros de b?per ? ?- Dr. Gwen Pounds: [phone] ? ?- Dra. Moye: [phone] ? ?- Dra. Roseanne Reno: [phone] ? ?En caso de inclemencias del tiempo, por favor llame a nuestra l?nea principal al [phone] para una actualizaci?n sobre el estado de cualquier retraso o cierre. ? ?Consejos para la medicaci?n en dermatolog?a: ?Por favor, guarde las cajas en las que vienen los medicamentos de uso t?pico para ayudarle a seguir las instrucciones sobre d?nde y c?mo usarlos. Las farmacias generalmente imprimen las instrucciones del medicamento s?lo en las cajas y no directamente en los tubos del [place].  ? ?Si su medicamento es muy caro, por favor, p?ngase en contacto con Rolm Gala llamando al [phone] y presione la opci?n 4 o env?enos un mensaje a trav?s de MyChart.  ? ?No podemos decirle cu?l ser? su copago por los medicamentos por adelantado ya que esto es diferente dependiendo de la cobertura de su seguro. Sin embargo, es posible que podamos encontrar un medicamento sustituto a Audiological scientist un formulario para que el seguro cubra el medicamento que se considera necesario.  ? ?Si se requiere Neomia Dear autorizaci?n previa para que su compa??a de seguros [place] su medicamento, por favor perm?tanos de 1 a 2 d?as h?biles para  completar este proceso. ? ?Los precios de los medicamentos var?an con frecuencia dependiendo del Environmental consultant de d?nde se surte la receta y alguna farmacias pueden ofrecer precios m?s baratos. ? ?El sitio web www.goodrx.com tiene cupones para medicamentos de Health and safety inspector. Los precios aqu? no tienen en cuenta lo que podr?a costar con la ayuda del seguro (puede ser m?s barato con su seguro), pero el sitio web puede darle el precio si no utiliz? ning?n seguro.  ?- Puede imprimir el cup?n correspondiente y llevarlo con su receta a la farmacia.  ?- Tambi?n puede pasar por nuestra oficina durante el horario de atenci?n regular y recoger una tarjeta de cupones de GoodRx.  ?- Si necesita que su receta se env?e electr?nicamente a Psychiatrist, informe a nuestra oficina a trav?s de MyChart de [place] o por tel?fono llamando al [phone] y presione la opci?n 4.  ?

## 2022-03-27 ENCOUNTER — Encounter: Payer: Self-pay | Admitting: Dermatology

## 2022-07-09 ENCOUNTER — Ambulatory Visit: Payer: Medicare Other | Admitting: Dermatology

## 2024-02-12 ENCOUNTER — Ambulatory Visit: Payer: Medicare Other | Admitting: Dermatology

## 2024-02-12 DIAGNOSIS — L820 Inflamed seborrheic keratosis: Secondary | ICD-10-CM

## 2024-02-12 DIAGNOSIS — L570 Actinic keratosis: Secondary | ICD-10-CM | POA: Diagnosis not present

## 2024-02-12 DIAGNOSIS — Z7189 Other specified counseling: Secondary | ICD-10-CM

## 2024-02-12 DIAGNOSIS — I781 Nevus, non-neoplastic: Secondary | ICD-10-CM

## 2024-02-12 DIAGNOSIS — L578 Other skin changes due to chronic exposure to nonionizing radiation: Secondary | ICD-10-CM

## 2024-02-12 DIAGNOSIS — Z79899 Other long term (current) drug therapy: Secondary | ICD-10-CM

## 2024-02-12 DIAGNOSIS — L304 Erythema intertrigo: Secondary | ICD-10-CM

## 2024-02-12 DIAGNOSIS — W908XXA Exposure to other nonionizing radiation, initial encounter: Secondary | ICD-10-CM

## 2024-02-12 DIAGNOSIS — L821 Other seborrheic keratosis: Secondary | ICD-10-CM

## 2024-02-12 MED ORDER — KETOCONAZOLE 2 % EX CREA
TOPICAL_CREAM | CUTANEOUS | 11 refills | Status: AC
Start: 2024-02-12 — End: ?

## 2024-02-12 MED ORDER — HYDROCORTISONE 2.5 % EX CREA: TOPICAL_CREAM | CUTANEOUS | 11 refills | Status: AC

## 2024-02-12 NOTE — Patient Instructions (Addendum)
 Intertrigo is a chronic recurrent rash that occurs in skin fold areas that may be associated with friction; heat; moisture; yeast; fungus; and bacteria.  It is exacerbated by increased movement / activity; sweating; and higher atmospheric temperature.  Use of an absorbant powder such as Zeasorb AF powder or other OTC antifungal powder to the area daily can prevent rash recurrence. Other options to help keep the area dry include blow drying the area after bathing or using antiperspirant products such as Duradry sweat minimizing gel.  Start Hydrocortisone 2.5 % cream apply nightly on M-W-F  Start Ketoconazole 2 % cream apply topically to rash nightly on T,Th, Saturday       Seborrheic Keratosis  What causes seborrheic keratoses? Seborrheic keratoses are harmless, common skin growths that first appear during adult life.  As time goes by, more growths appear.  Some people may develop a large number of them.  Seborrheic keratoses appear on both covered and uncovered body parts.  They are not caused by sunlight.  The tendency to develop seborrheic keratoses can be inherited.  They vary in color from skin-colored to gray, brown, or even black.  They can be either smooth or have a rough, warty surface.   Seborrheic keratoses are superficial and look as if they were stuck on the skin.  Under the microscope this type of keratosis looks like layers upon layers of skin.  That is why at times the top layer may seem to fall off, but the rest of the growth remains and re-grows.    Treatment Seborrheic keratoses do not need to be treated, but can easily be removed in the office.  Seborrheic keratoses often cause symptoms when they rub on clothing or jewelry.  Lesions can be in the way of shaving.  If they become inflamed, they can cause itching, soreness, or burning.  Removal of a seborrheic keratosis can be accomplished by freezing, burning, or surgery. If any spot bleeds, scabs, or grows rapidly, please return  to have it checked, as these can be an indication of a skin cancer.   Cryotherapy Aftercare  Wash gently with soap and water everyday.   Apply Vaseline and Band-Aid daily until healed.    Actinic keratoses are precancerous spots that appear secondary to cumulative UV radiation exposure/sun exposure over time. They are chronic with expected duration over 1 year. A portion of actinic keratoses will progress to squamous cell carcinoma of the skin. It is not possible to reliably predict which spots will progress to skin cancer and so treatment is recommended to prevent development of skin cancer.  Recommend daily broad spectrum sunscreen SPF 30+ to sun-exposed areas, reapply every 2 hours as needed.  Recommend staying in the shade or wearing long sleeves, sun glasses (UVA+UVB protection) and wide brim hats (4-inch brim around the entire circumference of the hat). Call for new or changing lesions.    Gentle Skin Care Guide  1. Bathe no more than once a day.  2. Avoid bathing in hot water  3. Use a mild soap like Dove, Vanicream, Cetaphil, CeraVe. Can use Lever 2000 or Cetaphil antibacterial soap  4. Use soap only where you need it. On most days, use it under your arms, between your legs, and on your feet. Let the water rinse other areas unless visibly dirty.  5. When you get out of the bath/shower, use a towel to gently blot your skin dry, don't rub it.  6. While your skin is still a little damp, apply  a moisturizing cream such as Vanicream, CeraVe, Cetaphil, Eucerin, Sarna lotion or plain Vaseline Jelly. For hands apply Neutrogena [place] Hand Cream or Excipial Hand Cream.  7. Reapply moisturizer any time you start to itch or feel dry.  8. Sometimes using free and clear laundry detergents can be helpful. Fabric softener sheets should be avoided. Downy Free & Gentle liquid, or any liquid fabric softener that is free of dyes and perfumes, it acceptable to use  9. If your doctor has  given you prescription creams you may apply moisturizers over them          Due to recent changes in healthcare laws, you may see results of your pathology and/or laboratory studies on MyChart before the doctors have had a chance to review them. We understand that in some cases there may be results that are confusing or concerning to you. Please understand that not all results are received at the same time and often the doctors may need to interpret multiple results in order to provide you with the best plan of care or course of treatment. Therefore, we ask that you please give [place] 2 business days to thoroughly review all your results before contacting the office for clarification. Should we see a critical lab result, you will be contacted sooner.   If You Need Anything After Your Visit  If you have any questions or concerns for your doctor, please call our main line at [phone] and press option 4 to reach your doctor's medical assistant. If no one answers, please leave a voicemail as directed and we will return your call as soon as possible. Messages left after 4 pm will be answered the following business day.   You may also send [place] a message via MyChart. We typically respond to MyChart messages within 1-2 business days.  For prescription refills, please ask your pharmacy to contact our office. Our fax number is [phone].  If you have an urgent issue when the clinic is closed that cannot wait until the next business day, you can page your doctor at the number below.    Please note that while we do our best to be available for urgent issues outside of office hours, we are not available 24/7.   If you have an urgent issue and are unable to reach [place], you may choose to seek medical care at your doctor's office, retail clinic, urgent care center, or emergency room.  If you have a medical emergency, please immediately call 911 or go to the emergency department.  Pager Numbers  - Dr.  Gwen Pounds: [phone]  - Dr. Roseanne Reno: [phone]  - Dr. Katrinka Blazing: [phone]   In the event of inclement weather, please call our main line at [phone] for an update on the status of any delays or closures.  Dermatology Medication Tips: Please keep the boxes that topical medications come in in order to help keep track of the instructions about where and how to use these. Pharmacies typically print the medication instructions only on the boxes and not directly on the medication tubes.   If your medication is too expensive, please contact our office at [phone] option 4 or send [place] a message through MyChart.   We are unable to tell what your co-pay for medications will be in advance as this is different depending on your insurance coverage. However, we may be able to find a substitute medication at lower cost or fill out paperwork to get insurance to cover a needed medication.  If a prior authorization is required to get your medication covered by your insurance company, please allow [place] 1-2 business days to complete this process.  Drug prices often vary depending on where the prescription is filled and some pharmacies may offer cheaper prices.  The website www.goodrx.com contains coupons for medications through different pharmacies. The prices here do not account for what the cost may be with help from insurance (it may be cheaper with your insurance), but the website can give you the price if you did not use any insurance.  - You can print the associated coupon and take it with your prescription to the pharmacy.  - You may also stop by our office during regular business hours and pick up a GoodRx coupon card.  - If you need your prescription sent electronically to a different pharmacy, notify our office through [facility] or by phone at [phone] option 4.     Si Usted Necesita Algo Despus de Su Visita  Tambin puede enviarnos un mensaje a travs de Clinical cytogeneticist. Por lo  general respondemos a los mensajes de MyChart en el transcurso de 1 a 2 das hbiles.  Para renovar recetas, por favor pida a su farmacia que se ponga en contacto con nuestra oficina. Annie Sable de fax es [place] [phone].  Si tiene un asunto urgente cuando la clnica est cerrada y que no puede esperar hasta el siguiente da hbil, puede llamar/localizar a su doctor(a) al nmero que aparece a continuacin.   Por favor, tenga en cuenta que aunque hacemos todo lo posible para estar disponibles para asuntos urgentes fuera del horario de [place], no estamos disponibles las 24 horas del da, los 7 [address]  [address] de la [place].   Si tiene un problema urgente y no puede comunicarse con nosotros, puede optar por buscar atencin mdica  en el consultorio de su doctor(a), en una clnica privada, en un centro de atencin urgente o en una sala de emergencias.  Si tiene Engineer, drilling, por favor llame inmediatamente al 911 o vaya a la sala de emergencias.  Nmeros de bper  - Dr. Gwen Pounds: [phone]  - Dra. Roseanne Reno: [phone]  - Dr. Katrinka Blazing: [phone]   En caso de inclemencias del tiempo, por favor llame a Lacy Duverney principal al [phone] para una actualizacin sobre el [place] de cualquier retraso o cierre.  Consejos para la medicacin en dermatologa: Por favor, guarde las cajas en las que vienen los medicamentos de uso tpico para ayudarle a seguir las instrucciones sobre dnde y cmo usarlos. Las farmacias generalmente imprimen las instrucciones del medicamento slo en las cajas y no directamente en los tubos del [place].   Si su medicamento es muy caro, por favor, pngase en contacto con Rolm Gala llamando al [phone] y presione la opcin 4 o envenos un mensaje a travs de Clinical cytogeneticist.   No podemos decirle cul ser su copago por los medicamentos por adelantado ya que esto es diferente dependiendo de la cobertura de su seguro. Sin embargo, es posible que podamos encontrar  un medicamento sustituto a Audiological scientist un formulario para que el seguro cubra el medicamento que se considera necesario.   Si se requiere una autorizacin previa para que su compaa de seguros [place] su medicamento, por favor permtanos de 1 a 2 das hbiles para completar [address]  Los precios de los medicamentos varan con frecuencia dependiendo del Environmental consultant de dnde se surte la receta y alguna farmacias pueden ofrecer precios ms baratos.  El sitio web  www.goodrx.com tiene cupones para medicamentos de Health and safety inspector. Los precios aqu no tienen en cuenta lo que podra costar con la ayuda del seguro (puede ser ms barato con su seguro), pero el sitio web puede darle el precio si no utiliz Tourist information centre manager.  - Puede imprimir el cupn correspondiente y llevarlo con su receta a la farmacia.  - Tambin puede pasar por nuestra oficina durante el horario de atencin regular y Education officer, museum una tarjeta de cupones de GoodRx.  - Si necesita que su receta se enve electrnicamente a una farmacia diferente, informe a nuestra oficina a travs de MyChart de [place] o por telfono llamando al [phone] y presione la opcin 4.

## 2024-02-12 NOTE — Progress Notes (Unsigned)
 Follow-Up Visit   Subjective  Erin Turner is a 78 y.o. female who presents for the following: patient reports she is concerned about several things listed below to have checked.  Right side of neck, spots of neck , itchy spots at inframammary, right side of nose, circular red patch at left chest upper The patient has spots, moles and lesions to be evaluated, some may be new or changing and the patient may have concern these could be cancer.  The following portions of the chart were reviewed this encounter and updated as appropriate: medications, allergies, medical history  Review of Systems:  No other skin or systemic complaints except as noted in HPI or Assessment and Plan.  Objective  Well appearing patient in no apparent distress; mood and affect are within normal limits.   A focused examination was performed of the following areas: Lower Abdominal folds, b/l inframammary folds, chest, b/l arms, face, neck , right thigh, groin,    Relevant exam findings are noted in the Assessment and Plan.  right neck x 1, neck / hairline x 11, chest / abdomen x 10, left thigh x 2 (24) Erythematous stuck-on, waxy papule or plaque right nose  x 1 Erythematous thin papules/macules with gritty scale.   Assessment & Plan    SEBORRHEIC KERATOSIS - Stuck-on, waxy, tan-brown papules and/or plaques  - Benign-appearing - Discussed benign etiology and prognosis. 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 Condition is bothersome/symptomatic for patient. Currently flared. Intertrigo is a chronic recurrent rash that occurs in skin fold areas that may be associated with friction;  heat; moisture; yeast; fungus; and bacteria.  It is exacerbated by increased movement / activity; sweating; and higher atmospheric temperature.  Use of an absorbant powder such as Zeasorb AF powder or other OTC antifungal powder to the area daily can prevent rash recurrence. Other options to help keep the area dry include blow drying the area after bathing or using antiperspirant products such as Duradry sweat minimizing gel.  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  - Recommend staying in the shade or wearing long sleeves, sun glasses (UVA+UVB protection) and wide brim hats (4-inch brim around the entire circumference of the hat). - Call for new or changing lesions.  ERYTHEMA INTERTRIGO   Related Medications hydrocortisone 2.5 % cream Apply topically to rash areas at body folds and groin nightly on Mon-Wed-Fridays ketoconazole (NIZORAL) 2 % cream Apply topically to rash areas at body folds nightly on T,Th, Sat, Sunday INFLAMED SEBORRHEIC KERATOSIS (24) right neck x 1, neck / hairline x 11, chest / abdomen x 10, left thigh x 2 (24) Symptomatic, irritating, patient would like treated. Destruction of lesion - right neck x 1, neck / hairline x 11, chest / abdomen x 10, left thigh x 2 (24) Complexity: simple   Destruction method: cryotherapy   Informed consent: discussed and  consent obtained   Timeout:  patient name, date of birth, surgical site, and procedure verified Lesion destroyed using liquid nitrogen: Yes   Region frozen until ice ball extended beyond lesion: Yes   Outcome: patient tolerated  procedure well with no complications   Post-procedure details: wound care instructions given   ACTINIC KERATOSIS right nose  x 1 Actinic keratoses are precancerous spots that appear secondary to cumulative UV radiation exposure/sun exposure over time. They are chronic with expected duration over 1 year. A portion of actinic keratoses will progress to squamous cell carcinoma of the skin. It is not possible to reliably predict which spots will progress to skin cancer and so treatment is recommended to prevent development of skin cancer.  Recommend daily broad spectrum sunscreen SPF 30+ to sun-exposed areas, reapply every 2 hours as needed.  Recommend staying in the shade or wearing long sleeves, sun glasses (UVA+UVB protection) and wide brim hats (4-inch brim around the entire circumference of the hat). Call for new or changing lesions. Destruction of lesion - right nose  x 1 Complexity: simple   Destruction method: cryotherapy   Informed consent: discussed and consent obtained   Timeout:  patient name, date of birth, surgical site, and procedure verified Lesion destroyed using liquid nitrogen: Yes   Region frozen until ice ball extended beyond lesion: Yes   Outcome: patient tolerated procedure well with no complications   Post-procedure details: wound care instructions given    Return in about 6 months (around 08/11/2024) for isk follow up.  IAsher Muir, CMA, am acting as scribe for Armida Sans, MD.   Documentation: I have reviewed the above documentation for accuracy and completeness, and I agree with the above.  Armida Sans, MD

## 2024-02-13 ENCOUNTER — Encounter: Payer: Self-pay | Admitting: Dermatology

## 2024-05-12 ENCOUNTER — Ambulatory Visit: Payer: Medicare Other | Admitting: Dermatology

## 2024-05-12 ENCOUNTER — Encounter: Payer: Self-pay | Admitting: Dermatology

## 2024-05-12 DIAGNOSIS — Z79899 Other long term (current) drug therapy: Secondary | ICD-10-CM

## 2024-05-12 DIAGNOSIS — S60511A Abrasion of right hand, initial encounter: Secondary | ICD-10-CM

## 2024-05-12 DIAGNOSIS — I781 Nevus, non-neoplastic: Secondary | ICD-10-CM

## 2024-05-12 DIAGNOSIS — L578 Other skin changes due to chronic exposure to nonionizing radiation: Secondary | ICD-10-CM

## 2024-05-12 DIAGNOSIS — L719 Rosacea, unspecified: Secondary | ICD-10-CM

## 2024-05-12 DIAGNOSIS — L304 Erythema intertrigo: Secondary | ICD-10-CM

## 2024-05-12 DIAGNOSIS — L821 Other seborrheic keratosis: Secondary | ICD-10-CM | POA: Diagnosis not present

## 2024-05-12 DIAGNOSIS — L820 Inflamed seborrheic keratosis: Secondary | ICD-10-CM | POA: Diagnosis not present

## 2024-05-12 DIAGNOSIS — Z7189 Other specified counseling: Secondary | ICD-10-CM

## 2024-05-12 MED ORDER — VALACYCLOVIR HCL 500 MG PO TABS: 500.0000 mg | ORAL_TABLET | ORAL | 2 refills | Status: AC

## 2024-05-12 NOTE — Progress Notes (Signed)
 Follow-Up Visit   Subjective  Erin Turner is a 78 y.o. female who presents for the following: ISK f/u, neck, hairline, chest abdomen, L thigh, itching all over, ~47yrs, Intetrigo lower abdominal, groin, HC 2.5% prn, Ketoconazole  2% cr prn, AK R nose LN2 02/12/24, did not resolve, check spot R hand pt just noticed The patient has spots, moles and lesions to be evaluated, some may be new or changing and the patient may have concern these could be cancer.  The following portions of the chart were reviewed this encounter and updated as appropriate: medications, allergies, medical history  Review of Systems:  No other skin or systemic complaints except as noted in HPI or Assessment and Plan.  Objective  Well appearing patient in no apparent distress; mood and affect are within normal limits.   A focused examination was performed of the following areas: Face, hairline, back, abdomen, right hand  Relevant exam findings are noted in the Assessment and Plan.  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  Typically we recommend at least 1-3 treatment sessions about 5-8 weeks apart for best results.  Cannot have tanned skin when BBL performed, and regular use of sunscreen/photoprotection is advised after the procedure to help maintain results. The patient's condition may also require "maintenance treatments" in the future.  The fee for BBL / laser treatments is $350 per treatment session for the whole face.  The fee for BBL to face and  chest if treated at same visit is $500.  A fee can be quoted for other parts of the body.  Insurance typically does not pay for BBL/laser treatments and therefore the fee is an out-of-pocket cost. Recommend prophylactic valtrex treatment. Once scheduled for procedure, will send Rx in prior to patient's appointment.   SEBORRHEIC KERATOSIS Face, trunk, arms, legs - Stuck-on, waxy, tan-brown papules and/or plaques  - Benign-appearing - Discussed benign etiology and prognosis. - Observe - Call for any changes  INTERTRIGO Lower abdomen, groin Exam: Erythematous macerated patches in body folds Chronic and persistent condition with duration or expected duration over one year. Condition is symptomatic/ bothersome to patient. Not currently at goal. Intertrigo is a chronic recurrent rash that occurs in skin fold areas that may be associated with friction; heat; moisture; yeast; fungus; and bacteria.  It is exacerbated by increased movement / activity; sweating; and higher atmospheric temperature.  Use of an absorbant powder such as Zeasorb AF powder or other OTC antifungal powder to the area daily can prevent rash recurrence. Other options to help keep the area dry include blow drying the area after bathing or using antiperspirant products such as Duradry sweat minimizing gel. Treatment Plan: Cont Ketoconazole  2% cr 3d/wk Tuesday, Thursday, Saturday HC 2.5% cr 3d/wk Monday, Wednesday, Friday  Topical steroids (such as triamcinolone, fluocinolone, fluocinonide, mometasone , clobetasol, halobetasol, betamethasone, hydrocortisone ) can cause thinning and lightening of the skin if they are used for too long in the same area. Your physician has selected the right strength medicine for your problem and area affected on the body. Please use your medication  only as directed by your physician to prevent side effects.   Long term medication management.  Patient is using long term (months to years) prescription  medication  to control their dermatologic condition.  These medications require periodic monitoring to evaluate for efficacy and side effects and may require periodic laboratory monitoring.  ABRASION R dorsal hand Exam: abrasion R dorsum hand Treatment Plan: Recommend topical antibiotic or  vaseline and bandage until healed  INFLAMED SEBORRHEIC KERATOSIS (52) face, legs, abdomen x 32, upper back x 20 (52) Symptomatic, irritating, patient would like treated. Destruction of lesion - face, legs, abdomen x 32, upper back x 20 (52) Complexity: simple   Destruction method: cryotherapy   Informed consent: discussed and consent obtained   Timeout:  patient name, date of birth, surgical site, and procedure verified Lesion destroyed using liquid nitrogen: Yes   Region frozen until ice ball extended beyond lesion: Yes   Outcome: patient tolerated procedure well with no complications   Post-procedure details: wound care instructions given   ACTINIC SKIN DAMAGE   SEBORRHEIC KERATOSIS   COUNSELING AND COORDINATION OF CARE   INTERTRIGO   MEDICATION MANAGEMENT   TELANGIECTASIA   ROSACEA    Return for schedule for BBL Telangiectasias face, 55m ISK f/u.  I, Rollie Clipper, RMA, am acting as scribe for Celine Collard, MD .   Documentation: I have reviewed the above documentation for accuracy and completeness, and I agree with the above.  Celine Collard, MD

## 2024-05-12 NOTE — Patient Instructions (Addendum)
 Cryotherapy Aftercare  Wash gently with soap and water everyday.   Apply Vaseline and Band-Aid daily until healed.    Counseling for BBL / IPL / Laser and Coordination of Care Discussed the treatment option of Broad Band Light (BBL) /Intense Pulsed Light (IPL)/ Laser for skin discoloration, including brown spots and redness.  Typically we recommend at least 1-3 treatment sessions about 5-8 weeks apart for best results.  Cannot have tanned skin when BBL performed, and regular use of sunscreen/photoprotection is advised after the procedure to help maintain results. The patient's condition may also require "maintenance treatments" in the future.  The fee for BBL / laser treatments is $350 per treatment session for the whole face and $500 per treatment session for face and chest treated at same appointment.  A fee can be quoted for other parts of the body.  Insurance typically does not pay for BBL/laser treatments and therefore the fee is an out-of-pocket cost. Recommend prophylactic valtrex treatment. Once scheduled for procedure, will send Rx in prior to patient's appointment.     Due to recent changes in healthcare laws, you may see results of your pathology and/or laboratory studies on MyChart before the doctors have had a chance to review them. We understand that in some cases there may be results that are confusing or concerning to you. Please understand that not all results are received at the same time and often the doctors may need to interpret multiple results in order to provide you with the best plan of care or course of treatment. Therefore, we ask that you please give us  2 business days to thoroughly review all your results before contacting the office for clarification. Should we see a critical lab result, you will be contacted sooner.   If You Need Anything After Your Visit  If you have any questions or concerns for your doctor, please call our main line at [phone] and press option 4  to reach your doctor's medical assistant. If no one answers, please leave a voicemail as directed and we will return your call as soon as possible. Messages left after 4 pm will be answered the following business day.   You may also send us  a message via MyChart. We typically respond to MyChart messages within 1-2 business days.  For prescription refills, please ask your pharmacy to contact our office. Our fax number is [phone].  If you have an urgent issue when the clinic is closed that cannot wait until the next business day, you can page your doctor at the number below.    Please note that while we do our best to be available for urgent issues outside of office hours, we are not available 24/7.   If you have an urgent issue and are unable to reach us , you may choose to seek medical care at your doctor's office, retail clinic, urgent care center, or emergency room.  If you have a medical emergency, please immediately call 911 or go to the emergency department.  Pager Numbers  - Dr. Bary Likes: [phone]  - Dr. Annette Barters: [phone]  - Dr. Felipe Horton: [phone]   In the event of inclement weather, please call our main line at [phone] for an update on the status of any delays or closures.  Dermatology Medication Tips: Please keep the boxes that topical medications come in in order to help keep track of the instructions about where and how to use these. Pharmacies typically print the medication instructions only on the boxes and not directly  on the medication tubes.   If your medication is too expensive, please contact our office at [phone] option 4 or send us  a message through MyChart.   We are unable to tell what your co-pay for medications will be in advance as this is different depending on your insurance coverage. However, we may be able to find a substitute medication at lower cost or fill out paperwork to get insurance to cover a needed medication.   If a prior  authorization is required to get your medication covered by your insurance company, please allow us  1-2 business days to complete this process.  Drug prices often vary depending on where the prescription is filled and some pharmacies may offer cheaper prices.  The website www.goodrx.com contains coupons for medications through different pharmacies. The prices here do not account for what the cost may be with help from insurance (it may be cheaper with your insurance), but the website can give you the price if you did not use any insurance.  - You can print the associated coupon and take it with your prescription to the pharmacy.  - You may also stop by our office during regular business hours and pick up a GoodRx coupon card.  - If you need your prescription sent electronically to a different pharmacy, notify our office through [facility] or by phone at [phone] option 4.     Si Usted Necesita Algo Despus de Su Visita  Tambin puede enviarnos un mensaje a travs de Clinical cytogeneticist. Por lo general respondemos a los mensajes de MyChart en el transcurso de 1 a 2 das hbiles.  Para renovar recetas, por favor pida a su farmacia que se ponga en contacto con nuestra oficina. Franz Jacks de fax es [place] [phone].  Si tiene un asunto urgente cuando la clnica est cerrada y que no puede esperar hasta el siguiente da hbil, puede llamar/localizar a su doctor(a) al nmero que aparece a continuacin.   Por favor, tenga en cuenta que aunque hacemos todo lo posible para estar disponibles para asuntos urgentes fuera del horario de [place], no estamos disponibles las 24 horas del da, los 7 [address]  [address] de la [place].   Si tiene un problema urgente y no puede comunicarse con nosotros, puede optar por buscar atencin mdica  en el consultorio de su doctor(a), en una clnica privada, en un centro de atencin urgente o en una sala de emergencias.  Si tiene Engineer, drilling, por favor llame  inmediatamente al 911 o vaya a la sala de emergencias.  Nmeros de bper  - Dr. Bary Likes: [phone]  - Dra. Annette Barters: [phone]  - Dr. Felipe Horton: [phone]   En caso de inclemencias del tiempo, por favor llame a [place] principal al [phone] para una actualizacin sobre el [place] de cualquier retraso o cierre.  Consejos para la medicacin en dermatologa: Por favor, guarde las cajas en las que vienen los medicamentos de uso tpico para ayudarle a seguir las instrucciones sobre dnde y cmo usarlos. Las farmacias generalmente imprimen las instrucciones del medicamento slo en las cajas y no directamente en los tubos del [place].   Si su medicamento es muy caro, por favor, pngase en contacto con Bettyjane Brunet llamando al [phone] y presione la opcin 4 o envenos un mensaje a travs de Clinical cytogeneticist.   No podemos decirle cul ser su copago por los medicamentos por adelantado ya que esto es diferente dependiendo de la cobertura de su seguro. Sin embargo, es posible que podamos encontrar un medicamento  sustituto a Audiological scientist un formulario para que el seguro cubra el medicamento que se considera necesario.   Si se requiere una autorizacin previa para que su compaa de seguros [place] su medicamento, por favor permtanos de 1 a 2 das hbiles para completar este proceso.  Los precios de los medicamentos varan con frecuencia dependiendo del Environmental consultant de dnde se surte la receta y alguna farmacias pueden ofrecer precios ms baratos.  El sitio web www.goodrx.com tiene cupones para medicamentos de Health and safety inspector. Los precios aqu no tienen en cuenta lo que podra costar con la ayuda del seguro (puede ser ms barato con su seguro), pero el sitio web puede darle el precio si no utiliz Tourist information centre manager.  - Puede imprimir el cupn correspondiente y llevarlo con su receta a la farmacia.  - Tambin puede pasar por nuestra oficina durante el horario de atencin regular y  Education officer, museum una tarjeta de cupones de GoodRx.  - Si necesita que su receta se enve electrnicamente a una farmacia diferente, informe a nuestra oficina a travs de MyChart de [place] o por telfono llamando al [phone] y presione la opcin 4.

## 2024-07-22 ENCOUNTER — Ambulatory Visit (INDEPENDENT_AMBULATORY_CARE_PROVIDER_SITE_OTHER): Payer: Self-pay | Admitting: Dermatology

## 2024-07-22 DIAGNOSIS — I781 Nevus, non-neoplastic: Secondary | ICD-10-CM

## 2024-07-22 MED ORDER — MUPIROCIN 2 % EX OINT
TOPICAL_OINTMENT | CUTANEOUS | 0 refills | Status: AC
Start: 2024-07-22 — End: ?

## 2024-07-22 NOTE — Patient Instructions (Addendum)

## 2024-07-22 NOTE — Progress Notes (Unsigned)
 Follow-Up Visit   Subjective  Erin Turner is a 78 y.o. female who presents for the following: here for 2nd BBL treatment for telangectasia at nose, cheeks and chin and chest.    The following portions of the chart were reviewed this encounter and updated as appropriate: medications, allergies, medical history  Review of Systems:  No other skin or systemic complaints except as noted in HPI or Assessment and Plan.  Objective  Well appearing patient in no apparent distress; mood and affect are within normal limits.  A focused examination was performed of the following areas: Face and chest   Relevant exam findings are noted in the Assessment and Plan.  Telangectasias at left chest      Telangiectasis at nose, cheeks and chin            Treatment for telangiectasis     Assessment & Plan   TELANGIECTASIA Exam: dilated blood vessels at chest, cheeks, nose and chin  Treatment Plan: BBL today Laser safety: Patient was advised in laser safety.  Patient was fitted with laser safety goggles and advised to keep eyes closed during procedure with goggles on. Staff and provider ensured that patient and their own safety goggles were also on and eyes protected during procedure. Laser room door was secured and locked from the inside. Laser room door has laser safety sign affixed to the outside of the door.     [place] BBL - 07/22/24 1700      Patient Details   Skin Type: I    Anesthestic Cream Applied: Yes    Photo Takes: Yes    Consent Signed: Yes      Treatment Details   Date: 07/22/24    Treatment #: 1    Area: chest    Filter: 1st Pass;2nd Pass      1st Pass   Location: C    Device: 560   vascular - chest   BBL j/cm2: 26    PW Msec Sec: 26    Target Temp: 20    7mm: this one    Pulse 57    2nd Pass   Location: F    Device: 560   vascular at cheeks, chin, nose   BBL j/cm2: 26    PW Msec Sec: 26    Target Temp: 20    11mm: this one      Patient tolerated the procedure well.   [name] was stressed. The patient will call with any problems, questions or concerns prior to their next appointment.  Patient given rx of mupirocin  2 % ointment to start- apply topically daily/bid to open wounds or blisters at face until resolved. 30 g 0 rf   SEBORRHEIC KERATOSIS - Stuck-on, waxy, tan-brown papules and/or plaques  - Benign-appearing - Discussed benign etiology and prognosis. - Observe - Call for any changes  ACTINIC DAMAGE - chronic, secondary to cumulative UV radiation exposure/sun exposure over time - diffuse scaly erythematous macules with underlying dyspigmentation - Recommend daily broad spectrum sunscreen SPF 30+ to sun-exposed areas, reapply every 2 hours as needed.  - Recommend staying in the shade or wearing long sleeves, sun glasses (UVA+UVB protection) and wide brim hats (4-inch brim around the entire circumference of the hat). - Call for new or changing lesions.    No follow-ups on file.  LILLETTE Erin Turner, CMA, am acting as scribe for Alm Rhyme, MD.   Documentation: I have reviewed the above documentation for accuracy and completeness, and I agree  with the above.  Alm Rhyme, MD

## 2024-07-23 ENCOUNTER — Encounter: Payer: Self-pay | Admitting: Dermatology

## 2024-08-26 ENCOUNTER — Ambulatory Visit (INDEPENDENT_AMBULATORY_CARE_PROVIDER_SITE_OTHER): Payer: Self-pay | Admitting: Dermatology

## 2024-08-26 DIAGNOSIS — I781 Nevus, non-neoplastic: Secondary | ICD-10-CM

## 2024-08-26 DIAGNOSIS — D1801 Hemangioma of skin and subcutaneous tissue: Secondary | ICD-10-CM

## 2024-08-26 NOTE — Patient Instructions (Signed)

## 2024-08-26 NOTE — Progress Notes (Unsigned)
 Follow-Up Visit   Subjective  Erin Turner is a 78 y.o. female who presents for the following: bbl on chest and face and chest.. Patient here for 2nd treatment.   The following portions of the chart were reviewed this encounter and updated as appropriate: medications, allergies, medical history  Review of Systems:  No other skin or systemic complaints except as noted in HPI or Assessment and Plan.  Objective  Well appearing patient in no apparent distress; mood and affect are within normal limits.   A focused examination was performed of the following areas: Face, chest, under right breast  Relevant exam findings are noted in the Assessment and Plan. Before photos               Hemangioma right inframammary    BBL at left chest, cheeks, chin, nose and right inframammary     Assessment & Plan   TELANGIECTASIA Exam: dilated blood vessel(s) at nose, chin, cheeks and left chest  see photos   Treatment Plan: BBL today  Laser safety: Patient was advised in laser safety.  Patient was fitted with laser safety goggles and advised to keep eyes closed during procedure with goggles on. Staff and provider ensured that patient and their own safety goggles were also on and eyes protected during procedure. Laser room door was secured and locked from the inside. Laser room door has laser safety sign affixed to the outside of the door.    [place] BBL - 08/26/24 1500      Patient Details   Skin Type: I    Anesthestic Cream Applied: Yes   nose   Photo Takes: Yes    Consent Signed: Yes    Improvement from Previous Treatment: Yes      Treatment Details   Date: 08/26/24    Treatment #: 2    Area: chest, cheeks, chin, nose,   telangectasias   Filter: 1st Pass      1st Pass   Location: C    Device: 560   left chest, right inframammary, cheeks, chin, nose   BBL j/cm2: 28    PW Msec Sec: 28    Target Temp: 20    Pulses: 124    7mm: this one      2nd Pass    Location: F    Device: 560    BBL j/cm2: 28    PW Msec Sec: 28    Target Temp: 20    Pulses: 124    7mm: this one        HEMANGIOMA Exam: red papule right inframammary see photo   Treatment plan: BBL today   Laser safety: Patient was advised in laser safety.  Patient was fitted with laser safety goggles and advised to keep eyes closed during procedure with goggles on. Staff and provider ensured that patient and their own safety goggles were also on and eyes protected during procedure. Laser room door was secured and locked from the inside. Laser room door has laser safety sign affixed to the outside of the door.   Patient tolerated the procedure well.   [name] was stressed. The patient will call with any problems, questions or concerns prior to their next appointment.   [place] BBL - 08/26/24 1500      Patient Details   Skin Type: I    Anesthestic Cream Applied: Yes   nose   Photo Takes: Yes    Consent Signed: Yes    Improvement from Previous Treatment: Yes  Treatment Details   Date: 08/26/24    Treatment #: 2    Area: chest, cheeks, chin, nose,   telangectasias   Filter: 1st Pass      1st Pass   Location: C    Device: 560   left chest, right inframammary,   BBL j/cm2: 28    PW Msec Sec: 28    Target Temp: 20    Pulses: 124    7mm: this one       Return for keep follow up as scheduled 09/22/2024.  IEleanor Blush, CMA, am acting as scribe for Alm Rhyme, MD.   Documentation: I have reviewed the above documentation for accuracy and completeness, and I agree with the above.  Alm Rhyme, MD

## 2024-08-27 ENCOUNTER — Encounter: Payer: Self-pay | Admitting: Dermatology

## 2024-09-22 ENCOUNTER — Encounter: Payer: Self-pay | Admitting: Dermatology

## 2024-09-22 ENCOUNTER — Ambulatory Visit: Admitting: Dermatology

## 2024-09-22 DIAGNOSIS — L820 Inflamed seborrheic keratosis: Secondary | ICD-10-CM | POA: Diagnosis not present

## 2024-09-22 DIAGNOSIS — L299 Pruritus, unspecified: Secondary | ICD-10-CM

## 2024-09-22 DIAGNOSIS — L219 Seborrheic dermatitis, unspecified: Secondary | ICD-10-CM | POA: Diagnosis not present

## 2024-09-22 DIAGNOSIS — Z7189 Other specified counseling: Secondary | ICD-10-CM

## 2024-09-22 DIAGNOSIS — I781 Nevus, non-neoplastic: Secondary | ICD-10-CM

## 2024-09-22 DIAGNOSIS — Z79899 Other long term (current) drug therapy: Secondary | ICD-10-CM

## 2024-09-22 MED ORDER — LIDOCAINE 5 % EX CREA: TOPICAL_CREAM | CUTANEOUS | 3 refills | Status: AC

## 2024-09-22 NOTE — Progress Notes (Unsigned)
 Follow-Up Visit   Subjective  Erin Turner is a 78 y.o. female who presents for the following: BBL follow up visit. Has had 2 treatments for telangiectasias of nose, chin, cheeks and chest. Here to see if she needs another treatment.   Check torso. Thinks may have more ISKs to treat.   C/O itching on back and scalp. Has Ketoconazole  cream and shampoo.   The patient has spots, moles and lesions to be evaluated, some may be new or changing and the patient may have concern these could be cancer.  The following portions of the chart were reviewed this encounter and updated as appropriate: medications, allergies, medical history  Review of Systems:  No other skin or systemic complaints except as noted in HPI or Assessment and Plan.  Objective  Well appearing patient in no apparent distress; mood and affect are within normal limits.  A focused examination was performed of the following areas: Scalp, face, back, ears   Relevant physical exam findings are noted in the Assessment and Plan.  forehead and preauricular x24 (24) Erythematous keratotic or waxy stuck-on papule or plaque.  Assessment & Plan   SEBORRHEIC DERMATITIS With Pruritus of scalp Exam: Pink patches with greasy scale at face and ears.  Chronic and persistent condition with duration or expected duration over one year. Condition is symptomatic/ bothersome to patient. Not currently at goal. Seborrheic Dermatitis is a chronic persistent rash characterized by pinkness and scaling most commonly of the mid face but also can occur on the scalp (dandruff), ears; mid chest, mid back and groin.  It tends to be exacerbated by stress and cooler weather.  People who have neurologic disease may experience new onset or exacerbation of existing seborrheic dermatitis.  The condition is not curable but treatable and can be controlled.  Treatment Plan: Start Clobetasol shampoo 3 times per week lather on scalp, leave on 5-8 minutes,  rinse out. Continue Ketoconazole  2% cream twice daily to affected areas on face as needed.    Will prescribe [facility] compounded prescription anti-itch cream with Amitriptyline 5% / Gabapentin 10% / Lidocaine 5% Cream to apply to affected area on back twice daily as needed for itching.  .  The patient was advised this is not covered by insurance since it is made by a compounding pharmacy. They will receive an email to check out and the medication will be mailed to their home.    Telangiectasias Face and chest Much improved after BBL Pt will wait til future to consider another BBL tx.   INFLAMED SEBORRHEIC KERATOSIS (24) forehead and preauricular x24 (24) Symptomatic, irritating, patient would like treated.  Apply diclofenac (voltaren) gel twice a day to spots.   Destruction of lesion - forehead and preauricular x24 (24) Complexity: simple   Destruction method: cryotherapy   Informed consent: discussed and consent obtained   Timeout:  patient name, date of birth, surgical site, and procedure verified Lesion destroyed using liquid nitrogen: Yes   Region frozen until ice ball extended beyond lesion: Yes   Outcome: patient tolerated procedure well with no complications   Post-procedure details: wound care instructions given   Additional details:  Prior to procedure, discussed risks of blister formation, small wound, skin dyspigmentation, or rare scar following cryotherapy. Recommend Vaseline ointment to treated areas while healing.   SEBORRHEIC DERMATITIS   Related Medications ketoconazole  (NIZORAL ) 2 % shampoo apply three times per week, massage into scalp and leave in for 10 minutes before rinsing out mometasone  (ELOCON ) 0.1 %  lotion Apply to affected areas on scalp 1 hour prior to washin COUNSELING AND COORDINATION OF CARE   MEDICATION MANAGEMENT   PRURITUS     Return in about 6 months (around 03/22/2025).  I, Jill Parcell, CMA, am acting as scribe for Alm Rhyme, MD.   Documentation: I have reviewed the above documentation for accuracy and completeness, and I agree with the above.  Alm Rhyme, MD

## 2024-09-22 NOTE — Patient Instructions (Addendum)
 Start Clobetasol shampoo 3 times per week lather on scalp, leave on 5-8 minutes, rinse out. Continue Ketoconazole  2% cream twice daily to affected areas on face as needed.     Will prescribe [facility] compounded prescription anti-itch cream with Amitriptyline 5% / Gabapentin 10% / Lidocaine 5% Cream to apply to affected area on back twice daily as needed for itching.  .  The patient was advised this is not covered by insurance since it is made by a compounding pharmacy. They will receive an email to check out and the medication will be mailed to their home.     Instructions for [facility] Medications  One or more of your medications was sent to the [facility] mail order compounding pharmacy. You will receive an email from them and can purchase the medicine through that link. It will then be mailed to your home at the address you confirmed. If for any reason you do not receive an email from them, please check your spam folder. If you still do not find the email, please let us  know. [facility] phone number is [phone].    Cryotherapy Aftercare  Wash gently with soap and water everyday.   Apply Vaseline Jelly daily until healed.    Due to recent changes in healthcare laws, you may see results of your pathology and/or laboratory studies on MyChart before the doctors have had a chance to review them. We understand that in some cases there may be results that are confusing or concerning to you. Please understand that not all results are received at the same time and often the doctors may need to interpret multiple results in order to provide you with the best plan of care or course of treatment. Therefore, we ask that you please give us  2 business days to thoroughly review all your results before contacting the office for clarification. Should we see a critical lab result, you will be contacted sooner.   If You Need Anything After Your Visit  If you have any questions or  concerns for your doctor, please call our main line at [phone] and press option 4 to reach your doctor's medical assistant. If no one answers, please leave a voicemail as directed and we will return your call as soon as possible. Messages left after 4 pm will be answered the following business day.   You may also send us  a message via MyChart. We typically respond to MyChart messages within 1-2 business days.  For prescription refills, please ask your pharmacy to contact our office. Our fax number is [phone].  If you have an urgent issue when the clinic is closed that cannot wait until the next business day, you can page your doctor at the number below.    Please note that while we do our best to be available for urgent issues outside of office hours, we are not available 24/7.   If you have an urgent issue and are unable to reach us , you may choose to seek medical care at your doctor's office, retail clinic, urgent care center, or emergency room.  If you have a medical emergency, please immediately call 911 or go to the emergency department.  Pager Numbers  - Dr. Hester: [phone]  - Dr. Jackquline: [phone]  - Dr. Claudene: [phone]   - Dr. Raymund: [phone]  In the event of inclement weather, please call our main line at [phone] for an update on the status of any delays or closures.  Dermatology Medication Tips: Please  keep the boxes that topical medications come in in order to help keep track of the instructions about where and how to use these. Pharmacies typically print the medication instructions only on the boxes and not directly on the medication tubes.   If your medication is too expensive, please contact our office at [phone] option 4 or send us  a message through MyChart.   We are unable to tell what your co-pay for medications will be in advance as this is different depending on your insurance coverage. However, we may be able to find a  substitute medication at lower cost or fill out paperwork to get insurance to cover a needed medication.   If a prior authorization is required to get your medication covered by your insurance company, please allow us  1-2 business days to complete this process.  Drug prices often vary depending on where the prescription is filled and some pharmacies may offer cheaper prices.  The website www.goodrx.com contains coupons for medications through different pharmacies. The prices here do not account for what the cost may be with help from insurance (it may be cheaper with your insurance), but the website can give you the price if you did not use any insurance.  - You can print the associated coupon and take it with your prescription to the pharmacy.  - You may also stop by our office during regular business hours and pick up a GoodRx coupon card.  - If you need your prescription sent electronically to a different pharmacy, notify our office through [facility] or by phone at [phone] option 4.     Si Usted Necesita Algo Despus de Su Visita  Tambin puede enviarnos un mensaje a travs de Clinical cytogeneticist. Por lo general respondemos a los mensajes de MyChart en el transcurso de 1 a 2 das hbiles.  Para renovar recetas, por favor pida a su farmacia que se ponga en contacto con nuestra oficina. Randi lakes de fax es [place] [phone].  Si tiene un asunto urgente cuando la clnica est cerrada y que no puede esperar hasta el siguiente da hbil, puede llamar/localizar a su doctor(a) al nmero que aparece a continuacin.   Por favor, tenga en cuenta que aunque hacemos todo lo posible para estar disponibles para asuntos urgentes fuera del horario de [place], no estamos disponibles las 24 horas del da, los 7 [address]  [address] de la [place].   Si tiene un problema urgente y no puede comunicarse con nosotros, puede optar por buscar atencin mdica  en el consultorio de su doctor(a), en una clnica privada, en un  centro de atencin urgente o en una sala de emergencias.  Si tiene Engineer, drilling, por favor llame inmediatamente al 911 o vaya a la sala de emergencias.  Nmeros de bper  - Dr. Hester: [phone]  - Dra. Jackquline: [phone]  - Dr. Claudene: [phone]  - Dra. Kitts: [phone]  En caso de inclemencias del [place], por favor llame a nuestra lnea principal al [phone] para una actualizacin sobre el estado de cualquier retraso o cierre.  Consejos para la medicacin en dermatologa: Por favor, guarde las cajas en las que vienen los medicamentos de uso tpico para ayudarle a seguir las instrucciones sobre dnde y cmo usarlos. Las farmacias generalmente imprimen las instrucciones del medicamento slo en las cajas y no directamente en los tubos del [place].   Si su medicamento es muy caro, por favor, pngase en contacto con landry rieger llamando al [phone] y presione la opcin 4 o envenos  un mensaje a travs de MyChart.   No podemos decirle cul ser su copago por los medicamentos por adelantado ya que esto es diferente dependiendo de la cobertura de su seguro. Sin embargo, es posible que podamos encontrar un medicamento sustituto a Audiological scientist un formulario para que el seguro cubra el medicamento que se considera necesario.   Si se requiere una autorizacin previa para que su compaa de seguros malta su medicamento, por favor permtanos de 1 a 2 das hbiles para completar este proceso.  Los precios de los medicamentos varan con frecuencia dependiendo del Environmental consultant de dnde se surte la receta y alguna farmacias pueden ofrecer precios ms baratos.  El sitio web www.goodrx.com tiene cupones para medicamentos de Health and safety inspector. Los precios aqu no tienen en cuenta lo que podra costar con la ayuda del seguro (puede ser ms barato con su seguro), pero el sitio web puede darle el precio si no utiliz Tourist information centre manager.  - Puede imprimir el cupn  correspondiente y llevarlo con su receta a la farmacia.  - Tambin puede pasar por nuestra oficina durante el horario de atencin regular y Education officer, museum una tarjeta de cupones de GoodRx.  - Si necesita que su receta se enve electrnicamente a una farmacia diferente, informe a nuestra oficina a travs de MyChart de [place] o por telfono llamando al [phone] y presione la opcin 4.

## 2024-09-24 ENCOUNTER — Encounter: Payer: Self-pay | Admitting: Dermatology

## 2024-10-01 ENCOUNTER — Encounter: Payer: Self-pay | Admitting: Dermatology

## 2024-10-01 DIAGNOSIS — L219 Seborrheic dermatitis, unspecified: Secondary | ICD-10-CM

## 2024-10-01 MED ORDER — KETOCONAZOLE 2 % EX SHAM: MEDICATED_SHAMPOO | CUTANEOUS | 11 refills | Status: AC

## 2025-03-23 ENCOUNTER — Ambulatory Visit: Admitting: Dermatology
